# Patient Record
Sex: Male | Born: 1995
Health system: Southern US, Community
[De-identification: ages and names within clinical notes are randomized; demographics above are authoritative.]

## PROBLEM LIST (undated history)

## (undated) DIAGNOSIS — G40909 Epilepsy, unspecified, not intractable, without status epilepticus: Secondary | ICD-10-CM

## (undated) HISTORY — DX: Epilepsy, unspecified, not intractable, without status epilepticus: G40.909

## (undated) HISTORY — PX: CIRCUMCISION: SUR203

---

## 2017-05-23 DIAGNOSIS — G40909 Epilepsy, unspecified, not intractable, without status epilepticus: Secondary | ICD-10-CM

## 2017-05-23 HISTORY — DX: Epilepsy, unspecified, not intractable, without status epilepticus: G40.909

## 2018-01-29 ENCOUNTER — Encounter: Payer: Self-pay | Admitting: *Deleted

## 2018-02-02 ENCOUNTER — Encounter: Payer: Self-pay | Admitting: Diagnostic Neuroimaging

## 2018-02-02 ENCOUNTER — Ambulatory Visit (INDEPENDENT_AMBULATORY_CARE_PROVIDER_SITE_OTHER): Payer: Federal, State, Local not specified - PPO | Admitting: Diagnostic Neuroimaging

## 2018-02-02 VITALS — BP 113/67 | HR 74 | Ht 68.0 in | Wt 147.4 lb

## 2018-02-02 DIAGNOSIS — R569 Unspecified convulsions: Secondary | ICD-10-CM

## 2018-02-02 NOTE — Progress Notes (Signed)
[facility]  PATIENT: Peter Shaw DOB: 11-18-1996  REFERRING CLINICIAN: V Masotti, PA HISTORY FROM: patient  REASON FOR VISIT: new consult     HISTORICAL  CHIEF COMPLAINT:  Chief Complaint  Patient presents with  . Epilepsy    rm 7, New Pt, "seizure on 05/23/17 possibly provoked by work, lack of sleep; no meds x 6 months and no seizure activity"    HISTORY OF PRESENT ILLNESS:   22 year old right-handed male here for evaluation of seizure.  05/23/17 patient was awake at 3:00 in the morning getting ready for a flight, when all of a sudden he collapsed and had loss of consciousness.  No witnessed convulsions.  Patient was evaluated by EMS within 8 minutes.  He was noted to be in a deep sleep, snoring loudly.  Within a few minutes he was able to sit up, confused, but does not remember this.  He finally started to remember events once he was in the emergency room.  Triggering factors could have been sleep deprivation the night before.  No history of alcohol or illicit drugs. Patient has normal birth and development.  No academic problems.  No family history of seizure.  No prior history of seizure or syncope attacks.  In June 2018 had EEG which apparently was suggestive of generalized epilepsy with 2.5-3 Hz generalized spikes during hyperventilation, bifrontally predominant spike and wave in sleep, and a single left frontal spike.  Abnormal discharges noted to be in runs up to 7-10 seconds.  MRI of the brain was unremarkable.  Patient was recommended to start antiseizure medication levetiracetam. However patient was reluctant to start due to side effects.  No further events since that time.  [place]  Now patient living in [place] and attending [employer].   REVIEW OF SYSTEMS: Full 14 system review of systems performed and negative with exception of: only as per HPI.   ALLERGIES: No Known Allergies  HOME  MEDICATIONS: No outpatient medications prior to visit.   No facility-administered medications prior to visit.     PAST MEDICAL HISTORY: Past Medical History:  Diagnosis Date  . Epilepsy (HCC) 05/23/2017    PAST SURGICAL HISTORY: Past Surgical History:  Procedure Laterality Date  . CIRCUMCISION      FAMILY HISTORY: Family History  Problem Relation Age of Onset  . Coronary artery disease Father        triple bypass    SOCIAL HISTORY:  Social History   Socioeconomic History  . Marital status: Single    Spouse name: Not on file  . Number of children: Not on file  . Years of education: Not on file  . Highest education level: Not on file  Social Needs  . Financial resource strain: Not on file  . Food insecurity - worry: Not on file  . Food insecurity - inability: Not on file  . Transportation needs - medical: Not on file  . Transportation needs - non-medical: Not on file  Occupational History    Comment: in college  Tobacco Use  . Smoking status: Never Smoker  . Smokeless tobacco: Never Used  Substance and Sexual Activity  . Alcohol use: No    Frequency: Never  . Drug use: No  . Sexual activity: Not on file  Other Topics Concern  . Not on file  Social History Narrative   Lives with sister   In college [employer]   No caffeine    No children  PHYSICAL EXAM  GENERAL EXAM/CONSTITUTIONAL: Vitals:  Vitals:   02/02/18 0822  BP: 113/67  Pulse: 74  Weight: 147 lb 6.4 oz (66.9 kg)  Height: 5\' 8"  (1.727 m)     Body mass index is 22.41 kg/m.  Visual Acuity Screening   Right eye Left eye Both eyes  Without correction:     With correction: 20/30 20/30   Comments: contacts    Patient is in no distress; well developed, nourished and groomed; neck is supple  CARDIOVASCULAR:  Examination of carotid arteries is normal; no carotid bruits  Regular rate and rhythm, no murmurs  Examination of peripheral vascular system by observation and palpation is  normal  EYES:  Ophthalmoscopic exam of optic discs and posterior segments is normal; no papilledema or hemorrhages  MUSCULOSKELETAL:  Gait, strength, tone, movements noted in Neurologic exam below  NEUROLOGIC: MENTAL STATUS:  No flowsheet data found.  awake, alert, oriented to person, place and time  recent and remote memory intact  normal attention and concentration  language fluent, comprehension intact, naming intact,   fund of knowledge appropriate  CRANIAL NERVE:   2nd - no papilledema on fundoscopic exam  2nd, 3rd, 4th, 6th - pupils equal and reactive to light, visual fields full to confrontation, extraocular muscles intact, no nystagmus  5th - facial sensation symmetric  7th - facial strength symmetric  8th - hearing intact  9th - palate elevates symmetrically, uvula midline  11th - shoulder shrug symmetric  12th - tongue protrusion midline  MOTOR:   normal bulk and tone, full strength in the BUE, BLE  SENSORY:   normal and symmetric to light touch, temperature, vibration  COORDINATION:   finger-nose-finger, fine finger movements normal  REFLEXES:   deep tendon reflexes present and symmetric  GAIT/STATION:   narrow based gait; romberg is negative    DIAGNOSTIC DATA (LABS, IMAGING, TESTING) - I reviewed patient records, labs, notes, testing and imaging myself where available.  No results found for: WBC, HGB, HCT, MCV, PLT No results found for: NA, K, CL, CO2, GLUCOSE, BUN, CREATININE, CALCIUM, PROT, ALBUMIN, AST, ALT, ALKPHOS, BILITOT, GFRNONAA, GFRAA No results found for: CHOL, HDL, LDLCALC, LDLDIRECT, TRIG, CHOLHDL No results found for: ZOXW9U No results found for: VITAMINB12 No results found for: TSH  Outside records reviewed and summarized in HPI.     ASSESSMENT AND PLAN  22 y.o. year old male here with new onset seizure 05/23/17, with abnormal EEG suspicious for primarily generalized epilepsy.   Dx: suspected primary  generalized epilepsy  1. New onset seizure [facility])      PLAN:  - I recommend for patient to start anti-seizure medication (levetiracetam); however patient is reluctant to start due to potential side effects and lack of recurrent seizure in the last 9 months - monitor symptoms  Return in about 6 months (around 08/02/2018).  I reviewed images, labs, notes, records myself. I summarized findings and reviewed with patient, for this high risk condition (new onset seizure) requiring high complexity decision making.    Suanne Marker, MD 02/02/2018, 8:44 AM Certified in Neurology, Neurophysiology and Neuroimaging  [facility] [address], [address] [place], [place] [address] [phone]

## 2018-02-02 NOTE — Patient Instructions (Signed)
-   consider starting anti-seizure medication (levetiracetam)  - Please maintain precautions. Caution with activities where a loss of awareness could harm you or someone else. Caution with swimming alone, tub bathing, hot tubs, driving, operating motorized vehicles (cars, ATVs, motocycles, etc), lawnmowers, power tools or firearms. Caution standing at heights, such as rooftops, ladders or stairs. Avoid hot objects such as stoves, heaters, open fires. Wear a helmet when riding a bicycle, scooter, skateboard, etc. and avoid areas of traffic. Set your water heater to 120 degrees or less.

## 2018-08-11 ENCOUNTER — Ambulatory Visit: Payer: Self-pay | Admitting: Diagnostic Neuroimaging

## 2018-12-02 ENCOUNTER — Encounter: Payer: Self-pay | Admitting: Diagnostic Neuroimaging

## 2018-12-02 ENCOUNTER — Ambulatory Visit: Payer: Federal, State, Local not specified - PPO | Admitting: Diagnostic Neuroimaging

## 2018-12-02 VITALS — BP 128/62 | HR 88 | Ht 68.0 in | Wt 154.2 lb

## 2018-12-02 DIAGNOSIS — R569 Unspecified convulsions: Secondary | ICD-10-CM | POA: Diagnosis not present

## 2018-12-02 DIAGNOSIS — G40309 Generalized idiopathic epilepsy and epileptic syndromes, not intractable, without status epilepticus: Secondary | ICD-10-CM | POA: Diagnosis not present

## 2018-12-02 NOTE — Progress Notes (Signed)
[facility]  PATIENT: Peter Shaw DOB: [birth year]  REFERRING CLINICIAN: V Masotti, PA HISTORY FROM: patient  REASON FOR VISIT: follow up    HISTORICAL  CHIEF COMPLAINT:  Chief Complaint  Patient presents with  . Seizures    rm 6, "never started anti seizure medication; no events, episodes since Feb"  . Follow-up    HISTORY OF PRESENT ILLNESS:   UPDATE (12/02/18, VRP): Since last visit, doing well. No seizures.  Patient asking about his risk for future seizures.  Patient still reluctant to start on antiseizure medication due to concern of potential long-term side effects.  Patient is finishing up his undergraduate degree.  He would like to apply to medical school.  PRIOR HPI (02/02/18): 22 year old right-handed male here for evaluation of seizure.  05/23/17 patient was awake at 3:00 in the morning getting ready for a flight, when all of a sudden he collapsed and had loss of consciousness.  No witnessed convulsions.  Patient was evaluated by EMS within 8 minutes.  He was noted to be in a deep sleep, snoring loudly.  Within a few minutes he was able to sit up, confused, but does not remember this.  He finally started to remember events once he was in the emergency room.  Triggering factors could have been sleep deprivation the night before.  No history of alcohol or illicit drugs. Patient has normal birth and development.  No academic problems.  No family history of seizure.  No prior history of seizure or syncope attacks.  In June 2018 had EEG which apparently was suggestive of generalized epilepsy with 2.5-3 Hz generalized spikes during hyperventilation, bifrontally predominant spike and wave in sleep, and a single left frontal spike.  Abnormal discharges noted to be in runs up to 7-10 seconds.  MRI of the brain was unremarkable.  Patient was recommended to start antiseizure medication levetiracetam. However patient was reluctant to start due to side effects.  No  further events since that time.  [place]  [place] in [place] and attending [employer].     REVIEW OF SYSTEMS: Full 14 system review of systems performed and negative with exception of: only as per HPI.   ALLERGIES: No Known Allergies  HOME MEDICATIONS: No outpatient medications prior to visit.   No facility-administered medications prior to visit.     PAST MEDICAL HISTORY: Past Medical History:  Diagnosis Date  . Epilepsy (HCC) 05/23/2017    PAST SURGICAL HISTORY: Past Surgical History:  Procedure Laterality Date  . CIRCUMCISION      FAMILY HISTORY: Family History  Problem Relation Age of Onset  . Coronary artery disease Father        triple bypass    SOCIAL HISTORY:  Social History   Socioeconomic History  . Marital status: Single    Spouse name: Not on file  . Number of children: Not on file  . Years of education: Not on file  . Highest education level: Not on file  Occupational History    Comment: in college  Social Needs  . Financial resource strain: Not on file  . Food insecurity:    Worry: Not on file    Inability: Not on file  . Transportation needs:    Medical: Not on file    Non-medical: Not on file  Tobacco Use  . Smoking status: Never Smoker  . Smokeless tobacco: Never Used  Substance and Sexual Activity  . Alcohol use: No  Frequency: Never  . Drug use: No  . Sexual activity: Not on file  Lifestyle  . Physical activity:    Days per week: Not on file    Minutes per session: Not on file  . Stress: Not on file  Relationships  . Social connections:    Talks on phone: Not on file    Gets together: Not on file    Attends religious service: Not on file    Active member of club or organization: Not on file    Attends meetings of clubs or organizations: Not on file    Relationship status: Not on file  . Intimate partner violence:    Fear of current or ex partner: Not on  file    Emotionally abused: Not on file    Physically abused: Not on file    Forced sexual activity: Not on file  Other Topics Concern  . Not on file  Social History Narrative   Lives with sister   In college [employer]   No caffeine    No children     PHYSICAL EXAM  GENERAL EXAM/CONSTITUTIONAL: Vitals:  Vitals:   12/02/18 1320  BP: 128/62  Pulse: 88  Weight: 154 lb 3.2 oz (69.9 kg)  Height: 5\' 8"  (1.727 m)   Body mass index is 23.45 kg/m. No exam data present  Patient is in no distress; well developed, nourished and groomed; neck is supple  CARDIOVASCULAR:  Examination of carotid arteries is normal; no carotid bruits  Regular rate and rhythm, no murmurs  Examination of peripheral vascular system by observation and palpation is normal  EYES:  Ophthalmoscopic exam of optic discs and posterior segments is normal; no papilledema or hemorrhages  MUSCULOSKELETAL:  Gait, strength, tone, movements noted in Neurologic exam below  NEUROLOGIC: MENTAL STATUS:  No flowsheet data found.  awake, alert, oriented to person, place and time  recent and remote memory intact  normal attention and concentration  language fluent, comprehension intact, naming intact,   fund of knowledge appropriate  CRANIAL NERVE:   2nd - no papilledema on fundoscopic exam  2nd, 3rd, 4th, 6th - pupils equal and reactive to light, visual fields full to confrontation, extraocular muscles intact, no nystagmus  5th - facial sensation symmetric  7th - facial strength symmetric  8th - hearing intact  9th - palate elevates symmetrically, uvula midline  11th - shoulder shrug symmetric  12th - tongue protrusion midline  MOTOR:   normal bulk and tone, full strength in the BUE, BLE  SENSORY:   normal and symmetric to light touch, temperature, vibration  COORDINATION:   finger-nose-finger, fine finger movements normal  REFLEXES:   deep tendon reflexes present and  symmetric  GAIT/STATION:   narrow based gait    DIAGNOSTIC DATA (LABS, IMAGING, TESTING) - I reviewed patient records, labs, notes, testing and imaging myself where available.  No results found for: WBC, HGB, HCT, MCV, PLT No results found for: NA, K, CL, CO2, GLUCOSE, BUN, CREATININE, CALCIUM, PROT, ALBUMIN, AST, ALT, ALKPHOS, BILITOT, GFRNONAA, GFRAA No results found for: CHOL, HDL, LDLCALC, LDLDIRECT, TRIG, CHOLHDL No results found for: ZOXW9U No results found for: VITAMINB12 No results found for: TSH   Outside records reviewed and summarized in HPI.     ASSESSMENT AND PLAN  22 y.o. year old male here with new onset seizure 05/23/17, with abnormal EEG suspicious for primarily generalized epilepsy.   Dx: suspected primary generalized epilepsy  1. New onset seizure (HCC)  PLAN:   PRIMARY GENERALIZED EPILEPSY - I again recommend for patient to start anti-seizure medication (levetiracetam); risks of breakthrough seizure were reviewed; however patient declines to start due to his concern about potential side effects; I offered second opinion at academic epilepsy clinic but he declined. - monitor symptoms  Return if symptoms worsen or fail to improve, for return to PCP.    Suanne Marker, MD 12/02/2018, 1:25 PM Certified in Neurology, Neurophysiology and Neuroimaging  [facility] [address] [place], [place] [address] [phone]

## 2019-12-02 ENCOUNTER — Other Ambulatory Visit: Payer: Self-pay

## 2019-12-02 ENCOUNTER — Telehealth: Payer: Self-pay | Admitting: Diagnostic Neuroimaging

## 2019-12-02 DIAGNOSIS — R000 Tachycardia, unspecified: Secondary | ICD-10-CM | POA: Diagnosis not present

## 2019-12-02 DIAGNOSIS — R569 Unspecified convulsions: Secondary | ICD-10-CM | POA: Diagnosis not present

## 2019-12-02 DIAGNOSIS — I10 Essential (primary) hypertension: Secondary | ICD-10-CM | POA: Diagnosis not present

## 2019-12-02 NOTE — Telephone Encounter (Signed)
I called pt about wanting to discuss visit from 11/2018. Pt stated he is having night time episodes of some kind of seizure at night  and when he wakes up. Pt stated he is not taking the seizure medication now but has the prescription. Offer first available with [facility] NP for mychart video visit on 12/07/2019 at 0830. I text pt the link for mychart to create account, I advise pt to create account for mychart so he will be able to access the chart. Pt was explain to log onto appt 10 minutes early and click appt for video visit. I advise pt again to create the account before his appt on 12/07/2019. He verbalized understanding.

## 2019-12-02 NOTE — Telephone Encounter (Signed)
Agree with mychart visit. Would recommend to start levetiracetam 500mg  twice a day if he agrees. Also no driving until seizure free x 6 months. -VRP

## 2019-12-02 NOTE — Telephone Encounter (Signed)
Pt is calling in requesting a call back to discuss his last visit and some concerns that he has regarding some courses of action.  CB# [phone]

## 2019-12-02 NOTE — Telephone Encounter (Signed)
Noted TY

## 2019-12-03 DIAGNOSIS — Z03818 Encounter for observation for suspected exposure to other biological agents ruled out: Secondary | ICD-10-CM | POA: Diagnosis not present

## 2019-12-06 NOTE — Patient Instructions (Signed)
According to [place] law, you can not drive unless you are seizure / syncope free for at least 6 months and under physician's care.  Please maintain precautions. Do not participate in activities where a loss of awareness could harm you or someone else. No swimming alone, no tub bathing, no hot tubs, no driving, no operating motorized vehicles (cars, ATVs, motocycles, etc), lawnmowers, power tools or firearms. No standing at heights, such as rooftops, ladders or stairs. Avoid hot objects such as stoves, heaters, open fires. Wear a helmet when riding a bicycle, scooter, skateboard, etc. and avoid areas of traffic. Set your water heater to 120 degrees or less.   Seizure, Adult A seizure is a sudden burst of abnormal electrical activity in the brain. Seizures usually last from 30 seconds to 2 minutes. They can cause many different symptoms. Usually, seizures are not harmful unless they last a long time. What are the causes? Common causes of this condition include:  Fever or infection.  Conditions that affect the brain, such as: ? A brain abnormality that you were born with. ? A brain or head injury. ? Bleeding in the brain. ? A tumor. ? Stroke. ? Brain disorders such as autism or cerebral palsy.  Low blood sugar.  Conditions that are passed from parent to child (are inherited).  Problems with substances, such as: ? Having a reaction to a drug or a medicine. ? Suddenly stopping the use of a substance (withdrawal). In some cases, the cause may not be known. A person who has repeated seizures over time without a clear cause has a condition called epilepsy. What increases the risk? You are more likely to get this condition if you have:  A family history of epilepsy.  Had a seizure in the past.  A brain disorder.  A history of head injury, lack of oxygen at birth, or strokes. What are the signs or symptoms? There are many types of seizures. The symptoms vary depending on the type of  seizure you have. Examples of symptoms during a seizure include:  Shaking (convulsions).  Stiffness in the body.  Passing out (losing consciousness).  Head nodding.  Staring.  Not responding to sound or touch.  Loss of bladder control and bowel control. Some people have symptoms right before and right after a seizure happens. Symptoms before a seizure may include:  Fear.  Worry (anxiety).  Feeling like you may vomit (nauseous).  Feeling like the room is spinning (vertigo).  Feeling like you saw or heard something before (dj vu).  Odd tastes or smells.  Changes in how you see. You may see flashing lights or spots. Symptoms after a seizure happens can include:  Confusion.  Sleepiness.  Headache.  Weakness on one side of the body. How is this treated? Most seizures will stop on their own in under 5 minutes. In these cases, no treatment is needed. Seizures that last longer than 5 minutes will usually need treatment. Treatment can include:  Medicines given through an IV tube.  Avoiding things that are known to cause your seizures. These can include medicines that you take for another condition.  Medicines to treat epilepsy.  Surgery to stop the seizures. This may be needed if medicines do not help. Follow these instructions at home: Medicines  Take over-the-counter and prescription medicines only as told by your doctor.  Do not eat or drink anything that may keep your medicine from working, such as alcohol. Activity  Do not do any activities that would  be dangerous if you had another seizure, like driving or swimming. Wait until your doctor says it is safe for you to do them.  If you live in the [place] ask your local DMV (department of motor vehicles) when you can drive.  Get plenty of rest. Teaching others Teach friends and family what to do when you have a seizure. They should:  Lay you on the ground.  Protect your head and body.  Loosen any tight  clothing around your neck.  Turn you on your side.  Not hold you down.  Not put anything into your mouth.  Know whether or not you need emergency care.  Stay with you until you are better.  General instructions  Contact your doctor each time you have a seizure.  Avoid anything that gives you seizures.  Keep a seizure diary. Write down: ? What you think caused each seizure. ? What you remember about each seizure.  Keep all follow-up visits as told by your doctor. This is important. Contact a doctor if:  You have another seizure.  You have seizures more often.  There is any change in what happens during your seizures.  You keep having seizures with treatment.  You have symptoms of being sick or having an infection. Get help right away if:  You have a seizure that: ? Lasts longer than 5 minutes. ? Is different than seizures you had before. ? Makes it harder to breathe. ? Happens after you hurt your head.  You have any of these symptoms after a seizure: ? Not being able to speak. ? Not being able to use a part of your body. ? Confusion. ? A bad headache.  You have two or more seizures in a row.  You do not wake up right after a seizure.  You get hurt during a seizure. These symptoms may be an emergency. Do not wait to see if the symptoms will go away. Get medical help right away. Call your local emergency services (911 in the [place] Do not drive yourself to the hospital. Summary  Seizures usually last from 30 seconds to 2 minutes. Usually, they are not harmful unless they last a long time.  Do not eat or drink anything that may keep your medicine from working, such as alcohol.  Teach friends and family what to do when you have a seizure.  Contact your doctor each time you have a seizure. This information is not intended to replace advice given to you by your health care provider. Make sure you discuss any questions you have with your health care  provider. Document Released: 05/27/2008 Document Revised: 02/26/2019 Document Reviewed: 02/26/2019 Elsevier Patient Education  [place].

## 2019-12-06 NOTE — Progress Notes (Signed)
PATIENT: Peter Shaw DOB: 06-Dec-1996  REASON FOR VISIT: follow up HISTORY FROM: patient  Virtual Visit via Telephone Note  I connected with Peter Shaw on 12/07/19 at  8:30 AM EST by telephone and verified that I am speaking with the correct person using two identifiers.   I discussed the limitations, risks, security and privacy concerns of performing an evaluation and management service by telephone and the availability of in person appointments. I also discussed with the patient that there may be a patient responsible charge related to this service. The patient expressed understanding and agreed to proceed.   History of Present Illness:  12/07/19 Peter Shaw is a 23 y.o. male here today for follow up for seizures. He reports that recently he has had "events" that are concerning for seizure activity. Three days ago he took a shower at 4:30am. About 5 minutes after getting into bed, his roommate reports that he lost consciousness and extremities became rigid. EMS called. EKG, vitals and CBG were reportedly normal. No obvious triggers that he can identify. No sleep deprivation. No increased stress. He does mention working out and being phsycially tired the day before this event. He does report having two beers the night before last event. He drinks socially but denies excessive alcohol use. He is not taking any antiepileptic medications since last being seen. He is very resistant to taking a medication daily or "for the rest of his life." he states that events are always at night and he does not feel that he is at any risk of harm to others. He lives on campus of [place]. He reports that he rarely has a need to drive as he can walk to everything he needs. He is currently planning to study medicine and wishes to pursue his DO.   History (copied from Dr Richrd Humbles note on 12/02/2018)  UPDATE (12/02/18, VRP): Since last visit, doing well. No seizures.  Patient asking about his risk for future  seizures.  Patient still reluctant to start on antiseizure medication due to concern of potential long-term side effects.  Patient is finishing up his undergraduate degree.  He would like to apply to medical school.  PRIOR HPI (02/02/18): 23 year old right-handed male here for evaluation of seizure. 05/23/17 patient was awake at 3:00 in the morning getting ready for a flight, when all of a sudden he collapsed and had loss of consciousness. No witnessed convulsions. Patient was evaluated by EMS within 8 minutes. He was noted to be in a deep sleep, snoring loudly. Within a few minutes he was able to sit up, confused, but does not remember this. He finally started to remember events once he was in the emergency room. Triggering factors could have been sleep deprivation the night before. No history of alcohol or illicit drugs. Patient has normal birth and development. No academic problems. No family history of seizure. No prior history of seizure or syncope attacks.  In June 2018had EEGwhich apparently was suggestive of generalized epilepsy with 2.5-3 Hz generalized spikes during hyperventilation, bifrontally predominant spike and wave in sleep, and a single left frontal spike. Abnormal discharges noted to be in runs up to 7-10 seconds. MRI of the brain was unremarkable.  Patient was recommended to start antiseizure medication levetiracetam.However patient was reluctant to start due to side effects. No further events since that time.  Patient was previously living in [facility]. Now patient living in [place] and attending [employer].  Observations/Objective:  Generalized: Well developed, in no acute distress  Mentation: Alert oriented to time, place, history taking. Follows all commands speech and language fluent   Assessment and Plan:  23 y.o. year old male  has a past medical history of Epilepsy ([place]) (05/23/2017). here with    ICD-10-CM   1. Nonintractable  generalized idiopathic epilepsy without status epilepticus ([place])  G40.309 Ambulatory referral to Neurology   Mr. Marolyn Hammock has experienced events recently, most consistent with seizure activity.  He reports that these events always occur at night.  He has been unable to identify any specific trigger.  This is the first time he has noted seizure activity since being evaluated by Dr. Leta Baptist in February 2019.  He has been advised by Dr. Leta Baptist and previous neurologist in [place] to start antiepileptic medications.  EEG abnormal in 2018.  He has been very hesitant to do so.  He feels that all events are at night.  He feels that he is in no danger and is not danger to anyone else as he does not drive routinely.  He continues to pursue a degree of medicine and wishes to become a DO.  He is requesting that we refer him to a seizure specialist to consider alternative treatment plan.  He is not interested in taking antiepileptic medications.  I had a lengthy discussion with him regarding the etiology of epilepsy and the unpredictability of this disease.  Fortunately, events thus far have occurred at night with no obvious injuries or disability.  We have discussed side effects and effectiveness of levetiracetam.  He is adamant that he does not wish to take any medications at this time.  I will place a referral to Dr Delice Lesch with [facility] per his request.  He was advised, based on [place] law, cannot drive until seizure-free for 6 months.  Seizure precautions advised.  He should not operate any heavy machinery.  He was advised to seek medical attention immediately for any prolonged seizure activity.  He will follow-up with me in 6 months, sooner if needed.  He verbalizes understanding and agreement with this plan.  Orders Placed This Encounter  Procedures  . Ambulatory referral to Neurology    Referral Priority:   Routine    Referral Type:   Consultation    Referral Reason:   Specialty Services Required     Requested Specialty:   Neurology    Number of Visits Requested:   1    No orders of the defined types were placed in this encounter.    Follow Up Instructions:  I discussed the assessment and treatment plan with the patient. The patient was provided an opportunity to ask questions and all were answered. The patient agreed with the plan and demonstrated an understanding of the instructions.   The patient was advised to call back or seek an in-person evaluation if the symptoms worsen or if the condition fails to improve as anticipated.  I provided 30 minutes of non-face-to-face time during this encounter.  Patient is located at his dorm at [facility].  Provider is in the office.    Debbora Presto, NP

## 2019-12-07 ENCOUNTER — Telehealth (INDEPENDENT_AMBULATORY_CARE_PROVIDER_SITE_OTHER): Payer: Federal, State, Local not specified - PPO | Admitting: Family Medicine

## 2019-12-07 ENCOUNTER — Telehealth: Payer: Self-pay | Admitting: *Deleted

## 2019-12-07 ENCOUNTER — Encounter: Payer: Self-pay | Admitting: Family Medicine

## 2019-12-07 DIAGNOSIS — G40309 Generalized idiopathic epilepsy and epileptic syndromes, not intractable, without status epilepticus: Secondary | ICD-10-CM | POA: Diagnosis not present

## 2019-12-07 NOTE — Telephone Encounter (Signed)
I called pt and he stated that they were basically finished with appt.  He asked about epileptologist referral (would like ofv close to home [facility] if ok).

## 2019-12-08 ENCOUNTER — Encounter: Payer: Self-pay | Admitting: Neurology

## 2019-12-09 NOTE — Progress Notes (Signed)
I reviewed note and agree with plan.   Penni Bombard, MD 03/52/4818, 5:90 PM Certified in Neurology, Neurophysiology and Neuroimaging  [facility] [address], [place], [place] [address] [phone]

## 2019-12-31 ENCOUNTER — Ambulatory Visit: Payer: Federal, State, Local not specified - PPO | Admitting: Neurology

## 2020-01-19 ENCOUNTER — Ambulatory Visit: Payer: Federal, State, Local not specified - PPO | Admitting: Neurology

## 2020-01-26 ENCOUNTER — Other Ambulatory Visit: Payer: Self-pay

## 2020-01-26 ENCOUNTER — Encounter: Payer: Self-pay | Admitting: Neurology

## 2020-01-26 ENCOUNTER — Ambulatory Visit: Payer: Federal, State, Local not specified - PPO | Admitting: Neurology

## 2020-01-26 VITALS — BP 145/72 | HR 60 | Ht 68.0 in | Wt 166.6 lb

## 2020-01-26 DIAGNOSIS — G40309 Generalized idiopathic epilepsy and epileptic syndromes, not intractable, without status epilepticus: Secondary | ICD-10-CM

## 2020-01-26 NOTE — Patient Instructions (Signed)
Great meeting you! Continue to avoid seizure triggers as much as possible. Follow-up as needed. Good luck!  Seizure Precautions: 1. Avoid activities in which a seizure would cause danger to yourself or to others.  Don't operate dangerous machinery, swim alone, or climb in high or dangerous places, such as on ladders, roofs, or girders.  Do not drive unless your doctor says you may.  2. If you have any warning that you may have a seizure, lay down in a safe place where you can't hurt yourself.    3.  No driving for 6 months from last seizure, as per [facility].   Please refer to the following link on the Epilepsy Foundation of America's website for more information: http://www.epilepsyfoundation.org/answerplace/Social/driving/drivingu.cfm   4.  Maintain good sleep hygiene. Avoid alcohol.  5.  Contact your doctor if you have any problems that may be related to the medicine you are taking.  6.  Call 911 and bring the patient back to the ED if:        A.  The seizure lasts longer than 5 minutes.       B.  The patient doesn't awaken shortly after the seizure  C.  The patient has new problems such as difficulty seeing, speaking or moving  D.  The patient was injured during the seizure  E.  The patient has a temperature over 102 F (39C)  F.  The patient vomited and now is having trouble breathing

## 2020-01-26 NOTE — Progress Notes (Signed)
NEUROLOGY CONSULTATION NOTE  Peter Shaw MRN: [id number] DOB: [birth year]  Referring provider: [name], NP Primary care provider: none listed  Reason for consult:  Second opinion regarding seizures   Thank you for your kind referral of Peter Shaw for consultation of the above symptoms. Although his history is well known to you, please allow me to reiterate it for the purpose of our medical record. He is alone in the office today. Records and images were personally reviewed where available.   HISTORY OF PRESENT ILLNESS: This is a pleasant 24 year old right-handed man presenting for second opinion regarding seizures. Records from Dr. Marjory Lies were reviewed. The first seizure occurred at age 89 right after graduation. he was preparing for a trip and woke up early at 4:30am, there were no warning symptoms, he woke up in the hospital with no memory of events. His mother had heard him fall and witnessed a GTC lasting 5 minutes. He was restless when EMS arrived, confused and disoriented, and brought to a hospital in [place]. He had an MRI and EEG. Per Dr. Richrd Humbles note, EEG in June 2018 was apparently suggestive of generalized epilepsy with 2.5-3 Hz generalized spikes during hyperventilation, bifrontally predominant spike and wave in sleep, and a single left frontal spike. Abnormal discharges noted to be in runs up to 7-10 seconds. MRI brain reportedly unremarkable. He was given a prescription for Keppra which he did not take. He traveled to [place] with no issues. The next seizure occurred around 2 years later in December 2020, again around 4am. He recalls falling asleep on the couch, he woke up at 3am, took a shower, then went back to bed at 4am. Around 10 minutes of falling asleep, his roommate heard a loud groan and found him off the bed. There was no report of shaking, he had rolled over, stiff, and was not responding for 5 minutes. No tongue bite or incontinence, he recalls seeing EMS  arrive and felt disoriented. His jaw hurt for a week after, no focal weakness. He reports that both occurred on days of very vigorous activity, the first episode occurred in the setting of sleep deprivation, he reports getting a full 8 hours prior to the second event, but was sleep deprived 2 nights prior. There was no alcohol with the first event, he had 1 beer 2 nights prior to the second one. No illicit substances. He denies any olfactory/gustatory hallucinations, deja vu, rising epigastric sensation, focal numbness/tingling/weakness, myoclonic jerks. He denies any headaches, dizziness, diplopia, dysarthria/dysphagia, neck/back pain, bowel/bladder dysfunction. 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   Epilepsy Risk Factors:  His brother had a GTCs (febrile) at age 75. Otherwise he had a normal birth and early development.  There is no history of febrile convulsions, CNS infections such as meningitis/encephalitis, significant traumatic brain injury, neurosurgical procedures.   PAST MEDICAL HISTORY: Past Medical History:  Diagnosis Date  . Epilepsy ([facility]) 05/23/2017    PAST SURGICAL HISTORY: Past Surgical History:  Procedure Laterality Date  . CIRCUMCISION      MEDICATIONS: No current outpatient medications on file prior to visit.   No current facility-administered medications on file prior to visit.    ALLERGIES: No Known Allergies  FAMILY HISTORY: Family History  Problem Relation Age of Onset  . Coronary artery disease Father        triple bypass    SOCIAL HISTORY: Social History   Socioeconomic History  . Marital status: Single  Spouse name: Not on file  . Number of children: Not on file  . Years of education: Not on file  . Highest education level: Not on file  Occupational History    Comment: in college  Tobacco Use  . Smoking status: Never Smoker  . Smokeless tobacco: Never Used  Substance and Sexual Activity  . Alcohol use: Yes     Comment: wine once a week  . Drug use: No  . Sexual activity: Not on file  Other Topics Concern  . Not on file  Social History Narrative   Lives with sister   In college [employer]   No caffeine    No children   Right handed    Social Determinants of Health   Financial Resource Strain:   . Difficulty of Paying Living Expenses: Not on file  Food Insecurity:   . Worried About Charity fundraiser in the Last Year: Not on file  . Ran Out of Food in the Last Year: Not on file  Transportation Needs:   . Lack of Transportation (Medical): Not on file  . Lack of Transportation (Non-Medical): Not on file  Physical Activity:   . Days of Exercise per Week: Not on file  . Minutes of Exercise per Session: Not on file  Stress:   . Feeling of Stress : Not on file  Social Connections:   . Frequency of Communication with Friends and Family: Not on file  . Frequency of Social Gatherings with Friends and Family: Not on file  . Attends Religious Services: Not on file  . Active Member of Clubs or Organizations: Not on file  . Attends Archivist Meetings: Not on file  . Marital Status: Not on file  Intimate Partner Violence:   . Fear of Current or Ex-Partner: Not on file  . Emotionally Abused: Not on file  . Physically Abused: Not on file  . Sexually Abused: Not on file    REVIEW OF SYSTEMS: Constitutional: No fevers, chills, or sweats, no generalized fatigue, change in appetite Eyes: No visual changes, double vision, eye pain Ear, nose and throat: No hearing loss, ear pain, nasal congestion, sore throat Cardiovascular: No chest pain, palpitations Respiratory:  No shortness of breath at rest or with exertion, wheezes GastrointestinaI: No nausea, vomiting, diarrhea, abdominal pain, fecal incontinence Genitourinary:  No dysuria, urinary retention or frequency Musculoskeletal:  No neck pain, back pain Integumentary: No rash, pruritus, skin lesions Neurological: as above Psychiatric: No  depression, insomnia, anxiety Endocrine: No palpitations, fatigue, diaphoresis, mood swings, change in appetite, change in weight, increased thirst Hematologic/Lymphatic:  No anemia, purpura, petechiae. Allergic/Immunologic: no itchy/runny eyes, nasal congestion, recent allergic reactions, rashes  PHYSICAL EXAM: Vitals:   01/26/20 1304 01/26/20 1306  BP: (!) 160/77 (!) 145/72  Pulse: 60   SpO2: 100%    General: No acute distress Head:  Normocephalic/atraumatic Skin/Extremities: No rash, no edema Neurological Exam: Mental status: alert and oriented to person, place, and time, no dysarthria or aphasia, Fund of knowledge is appropriate.  Recent and remote memory are intact. 3/3 delayed recall.  Attention and concentration are normal.    Able to name objects and repeat phrases. Cranial nerves: CN I: not tested CN II: pupils equal, round and reactive to light, visual fields intact CN III, IV, VI:  full range of motion, no nystagmus, no ptosis CN V: facial sensation intact CN VII: upper and lower face symmetric CN VIII: hearing intact to conversation Bulk & Tone: normal, no fasciculations.  Motor: 5/5 throughout with no pronator drift. Sensation: intact to light touch, cold, pin, vibration and joint position sense.  No extinction to double simultaneous stimulation.  Romberg test negative Deep Tendon Reflexes: +1 on both UE, +2 both LE, no ankle clonus Plantar responses: downgoing bilaterally Cerebellar: no incoordination on finger to nose testing Gait: narrow-based and steady, able to tandem walk adequately. Tremor: none  IMPRESSION: This is a pleasant 24 year old right-handed man with a history of 2 seizures in his lifetime, the first occurred at age 12, then most recently in December 2020. The second episode was a nocturnal seizure, the first episode occurred early in the morning in the setting of sleep deprivation. We discussed EEG findings from 2018 indicating genetic generalized  epilepsy, we discussed the diagnosis, prognosis, treatment, with extensive discussion about recurrence risks in patients with abnormal EEG. We discussed recommendation for starting seizure medication, benefits and risks, particularly risk for SUDEP. We also discussed [place] driving laws indicating no driving until 6 months seizure-free. All his questions were answered, he would like to think about it more but would like to hold off on medication at this time, understanding benefits and risks. We discussed avoidance of seizure triggers, including sleep deprivation and alcohol. He is graduating and may be in a different state in the next few months, he knows to call our office for any questions and will follow-up prn.    Thank you for allowing me to participate in the care of this patient. Please do not hesitate to call for any questions or concerns.   Patrcia Dolly, M.D.  CC: [name], NP

## 2020-08-02 ENCOUNTER — Telehealth: Payer: Self-pay | Admitting: Neurology

## 2020-08-02 NOTE — Telephone Encounter (Signed)
Patient called in wanting to alert Dr. Karel Jarvis of a seizure he recently had and see if she feels he may need to be seen sooner than 04/05/21. He had a seizure on 08/01/20 at 7AM. He said it reminded him of his second seizure he ever had. This seizure involved sleep deprovation, elevation, alcohol, and flashing lights. He had lock jaw for around 4 minutes. After it was over he went to the hospital and they did a CT scan. They did not find anything significant.

## 2020-08-02 NOTE — Telephone Encounter (Signed)
Spoke with pt who reports he had a 4 min seizure on Mon 07/31/20. He had traveled to the [address] with friends to go hiking. He says he was probably a little sleep deprived from travel on Friday, was at a higher altitude (is really wanting an opinion on this as a possible seizure trigger), had drank 2 beers and at the time of the seizure was in the bathroom where the lights were flashing. Was evaluated in the ED and had a normal CT. He is not on any AEDs. Pt understands that he is on the wait list for an appt and has the option of trying to find an earlier appt with a different office, he verbalized understanding.

## 2020-08-02 NOTE — Telephone Encounter (Signed)
Pt is on wait list, he is interested in discussing AEDs.

## 2020-08-02 NOTE — Telephone Encounter (Signed)
We had previously discussed recommendation that he start a seizure medication. Would he like to come in to discuss starting medication? If yes, we'll put him on waitlist. Thanks

## 2020-08-11 ENCOUNTER — Ambulatory Visit: Payer: Federal, State, Local not specified - PPO | Admitting: Neurology

## 2020-08-11 ENCOUNTER — Other Ambulatory Visit: Payer: Self-pay

## 2020-08-11 ENCOUNTER — Encounter: Payer: Self-pay | Admitting: Neurology

## 2020-08-11 VITALS — BP 136/77 | HR 80 | Ht 68.0 in | Wt 168.0 lb

## 2020-08-11 DIAGNOSIS — G40309 Generalized idiopathic epilepsy and epileptic syndromes, not intractable, without status epilepticus: Secondary | ICD-10-CM

## 2020-08-11 NOTE — Patient Instructions (Signed)
1. Schedule 1-hour EEG  2. Recommend looking at the Epilepsy.com website for more information  3. Let me know when you are ready to start medication, options include Keppra (Levetiracetam) and Lamictal (Lamotrigine  4. If you would like a referral to [facility] or another center with a D.O., let me know and I would be happy to send referral for second opinion  5. Follow-up in 6 months, call for any changes   Seizure Precautions: 1. If medication has been prescribed for you to prevent seizures, take it exactly as directed.  Do not stop taking the medicine without talking to your doctor first, even if you have not had a seizure in a long time.   2. Avoid activities in which a seizure would cause danger to yourself or to others.  Don't operate dangerous machinery, swim alone, or climb in high or dangerous places, such as on ladders, roofs, or girders.  Do not drive unless your doctor says you may.  3. If you have any warning that you may have a seizure, lay down in a safe place where you can't hurt yourself.    4.  No driving for 6 months from last seizure, as per [facility].   Please refer to the following link on the Epilepsy Foundation of America's website for more information: http://www.epilepsyfoundation.org/answerplace/Social/driving/drivingu.cfm   5.  Maintain good sleep hygiene. Avoid alcohol.  6.  Contact your doctor if you have any problems that may be related to the medicine you are taking.  7.  Call 911 and bring the patient back to the ED if:        A.  The seizure lasts longer than 5 minutes.       B.  The patient doesn't awaken shortly after the seizure  C.  The patient has new problems such as difficulty seeing, speaking or moving  D.  The patient was injured during the seizure  E.  The patient has a temperature over 102 F (39C)  F.  The patient vomited and now is having trouble breathing

## 2020-08-11 NOTE — Progress Notes (Signed)
NEUROLOGY FOLLOW UP OFFICE NOTE  Peter Shaw [id number] [birth year]  HISTORY OF PRESENT ILLNESS: I had the pleasure of seeing Peter Shaw in follow-up in the neurology clinic on 08/11/2020.  The patient was last seen 6 months ago for primary generalized epilepsy. He is alone in the office today. Since his last visit, he called our office to report a seizure on 07/31/20. He had traveled to the [facility] to go hiking, they went on the hike on Saturday, then early morning Sunday he recalls waking up and brushing his teeth. He recalls the bathroom light was flashing, no warning symptoms, then witnesses saw him go down with rigid shaking for 4 minutes. He states this occurred 35-40 minutes after awakening. There was a little spit and foaming at the mouth, his sister noted a lot of water coming out like he was breathing through his teeth after. He has no recollection of events until he came to on the drive to the hospital, feeling confused. He was brought to a hospital in [place] where head CT was unremarkable, he was given IV fluids and Keppra. He states he only got 4 hours of sleep the night prior and drank 2 beers.    History on Initial Assessment 01/26/2020: This is a pleasant 24 year old right-handed man presenting for second opinion regarding seizures. Records from Dr. Marjory Lies were reviewed. The first seizure occurred at age 57 right after graduation. he was preparing for a trip and woke up early at 4:30am, there were no warning symptoms, he woke up in the hospital with no memory of events. His mother had heard him fall and witnessed a GTC lasting 5 minutes. He was restless when EMS arrived, confused and disoriented, and brought to a hospital in [place]. He had an MRI and EEG. Per Dr. Richrd Humbles note, EEG in June 2018 was apparently suggestive of generalized epilepsy with 2.5-3 Hz generalized spikes during hyperventilation, bifrontally predominant spike and wave in sleep, and a single left frontal  spike. Abnormal discharges noted to be in runs up to 7-10 seconds. MRI brain reportedly unremarkable. He was given a prescription for Keppra which he did not take. He traveled to [place] with no issues. The next seizure occurred around 2 years later in December 2020, again around 4am. He recalls falling asleep on the couch, he woke up at 3am, took a shower, then went back to bed at 4am. Around 10 minutes of falling asleep, his roommate heard a loud groan and found him off the bed. There was no report of shaking, he had rolled over, stiff, and was not responding for 5 minutes. No tongue bite or incontinence, he recalls seeing EMS arrive and felt disoriented. His jaw hurt for a week after, no focal weakness. He reports that both occurred on days of very vigorous activity, the first episode occurred in the setting of sleep deprivation, he reports getting a full 8 hours prior to the second event, but was sleep deprived 2 nights prior. There was no alcohol with the first event, he had 1 beer 2 nights prior to the second one. No illicit substances. He denies any olfactory/gustatory hallucinations, deja vu, rising epigastric sensation, focal numbness/tingling/weakness, myoclonic jerks. He denies any headaches, dizziness, diplopia, dysarthria/dysphagia, neck/back pain, bowel/bladder dysfunction. 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   Epilepsy Risk Factors:  His brother had a GTCs (febrile) at age 32. Otherwise he had a normal birth and early development.  There is no history  of febrile convulsions, CNS infections such as meningitis/encephalitis, significant traumatic brain injury, neurosurgical procedures.   PAST MEDICAL HISTORY: Past Medical History:  Diagnosis Date  . Epilepsy (HCC) 05/23/2017    MEDICATIONS: No current outpatient medications on file prior to visit.   No current facility-administered medications on file prior to visit.    ALLERGIES: No Known Allergies  FAMILY  HISTORY: Family History  Problem Relation Age of Onset  . Coronary artery disease Father        triple bypass    SOCIAL HISTORY: Social History   Socioeconomic History  . Marital status: Single    Spouse name: Not on file  . Number of children: Not on file  . Years of education: Not on file  . Highest education level: Not on file  Occupational History    Comment: in college  Tobacco Use  . Smoking status: Never Smoker  . Smokeless tobacco: Never Used  Substance and Sexual Activity  . Alcohol use: Yes    Comment: wine once a week  . Drug use: No  . Sexual activity: Not on file  Other Topics Concern  . 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 Difficulty of Paying Living Expenses: Not on file  Food Insecurity:   . Worried About Programme researcher, broadcasting/film/video in the Last Year: Not on file  . Ran Out of Food in the Last Year: Not on file  Transportation Needs:   . Lack of Transportation (Medical): Not on file  . Lack of Transportation (Non-Medical): Not on file  Physical Activity:   . Days of Exercise per Week: Not on file  . Minutes of Exercise per Session: Not on file  Stress:   . Feeling of Stress : Not on file  Social Connections:   . Frequency of Communication with Friends and Family: Not on file  . Frequency of Social Gatherings with Friends and Family: Not on file  . Attends Religious Services: Not on file  . Active Member of Clubs or Organizations: Not on file  . Attends Banker Meetings: Not on file  . Marital Status: Not on file  Intimate Partner Violence:   . Fear of Current or Ex-Partner: Not on file  . Emotionally Abused: Not on file  . Physically Abused: Not on file  . Sexually Abused: Not on file    PHYSICAL EXAM: Vitals:   08/11/20 1326  BP: 136/77  Pulse:  80  SpO2: 100%   General: No acute distress Head:  Normocephalic/atraumatic Skin/Extremities: No rash, no edema Neurological Exam: alert and oriented to person, place, and time. No aphasia or dysarthria. Fund of knowledge is appropriate.  Recent and remote memory are intact.  Attention and concentration are normal.  Cranial nerves: Pupils equal, round. Extraocular movements intact. No facial asymmetry. Motor: moves all extremities symmetrically.   IMPRESSION: This is a pleasant 24 yo RH man with a history of 3 seizures in his lifetime, the first occurred at age 73, another in December 2020, and most recently last 07/31/2020. The second seizure was a nocturnal seizure, the other ones occurred early in the morning. All of them have occurred in the setting of sleep deprivation, he was hiking/exerting himself prior to the last  2 ones. We again discussed EEG findings from 2018 indicating a genetic generalized epilepsy. We again discussed recommendation to start anti-epileptic medication, which he is very hesitant to do and to be taking long-term. We had an extensive discussion regarding common seizure triggers, he was advised to avoid seizure triggers, but also risks of recurrent seizures off medication. Options were discussed, including Levetiracetam and Lamotrigine. He may want to obtain a second opinion to help him with decision-making. We discussed repeating 1-hour EEG and he will let us know his decision about medication. He is aware of [place] driving laws to stop driving until 6 months seizure-free. Follow-up in 6 months, he knows to call for any changes.     Thank you for allowing me to participate in his care.  Please do not hesitate to call for any questions or concerns.  The duration of this appointment visit was 33 minutes minutes of face-to-face time with the patient.  Greater than 50% of this time was spent in counseling, explanation of diagnosis, planning of further management, and coordination of  care.   Patrcia Dolly, M.D.

## 2020-08-21 ENCOUNTER — Ambulatory Visit (INDEPENDENT_AMBULATORY_CARE_PROVIDER_SITE_OTHER): Payer: Federal, State, Local not specified - PPO | Admitting: Neurology

## 2020-08-21 ENCOUNTER — Telehealth: Payer: Self-pay | Admitting: Neurology

## 2020-08-21 ENCOUNTER — Other Ambulatory Visit: Payer: Self-pay

## 2020-08-21 DIAGNOSIS — G40309 Generalized idiopathic epilepsy and epileptic syndromes, not intractable, without status epilepticus: Secondary | ICD-10-CM | POA: Diagnosis not present

## 2020-08-21 NOTE — Telephone Encounter (Signed)
Patient would like to try Lamotrigine per Dr Aquino's suggestion. [facility] [address]. Please call

## 2020-08-21 NOTE — Procedures (Signed)
ELECTROENCEPHALOGRAM REPORT  Date of Study: 08/21/2020  Patient's Name: Peter Shaw MRN: [id number] Date of Birth: Nov 15, 1996  Referring Provider: Dr. Patrcia Dolly  Clinical History: This is a 24 year old man with recurrent seizures. EEG for classification.  Medications: none  Technical Summary: A multichannel digital 1-hour EEG recording measured by the international 10-20 system with electrodes applied with paste and impedances below 5000 ohms performed in our laboratory with EKG monitoring in an awake and asleep patient.  Hyperventilation was not performed. Photic stimulation was performed.  The digital EEG was referentially recorded, reformatted, and digitally filtered in a variety of bipolar and referential montages for optimal display.    Description: The patient is awake and asleep during the recording.  During maximal wakefulness, there is a symmetric, medium voltage 9.5-10 Hz posterior dominant rhythm that attenuates with eye opening.  The record is symmetric.  During drowsiness and sleep, there is an increase in theta slowing of the background.  Vertex waves and symmetric sleep spindles were seen.  Photic stimulation did not elicit any abnormalities.  There were occasional generalized high voltage 3-4 Hz spike and polyspike and wave discharges with frontal predominance seen lasting 1-3 seconds. There were no electrographic seizures seen.    EKG lead was unremarkable.  Impression: This 1-hour awake and asleep EEG is abnormal due to occasional generalized high voltage 3-4 Hz spike and polyspike and wave discharges with frontal predominance lasting 1-3 seconds.  Clinical Correlation of the above findings is consistent with a primary generalized epilepsy. Clinical correlation is advised.   Patrcia Dolly, M.D.

## 2020-08-22 MED ORDER — LAMOTRIGINE ER 25 MG PO TB24: ORAL_TABLET | ORAL | 1 refills | Status: DC

## 2020-08-22 NOTE — Telephone Encounter (Signed)
Pls let him know I sent in a prescription for extended-release Lamotrigine to the [facility] on [address]. Instructions are to take 1 tablet daily for 2 weeks, then increase to 2 tablets daily. Please have him call our office after 2 weeks of taking the 2 tabs, if no side effects, we will increase to 100mg  tablet once a day and stay on this dose.   Please let him know that sometimes insurance will not approve the extended-release. If that is the case, we will need to do immediate release, which is taken twice a day instead of once a day. Call if any issues with extended-release when he gets from pharmacy. Thanks

## 2020-08-22 NOTE — Telephone Encounter (Signed)
Patient is calling back to check  The status of this message please call today

## 2020-08-23 NOTE — Telephone Encounter (Signed)
Called patient and informed him of instructions given to him by Dr. Karel Jarvis. Patient verbalized understanding and will call if there are any issues.

## 2020-08-24 ENCOUNTER — Telehealth: Payer: Self-pay

## 2020-08-24 NOTE — Telephone Encounter (Signed)
-----   Message from Karen M Aquino, MD sent at 08/22/2020  1:23 PM EDT ----- Pls let him know that his EEG showed the same changes as his last EEG, indicating a tendency for recurrent seizures if he is not taking medication. Please let us know when he is ready to start medication. Thanks 

## 2020-08-30 NOTE — Telephone Encounter (Signed)
Called patient and informed him of results. Patient stated he has been put on Lamotrigine. Patient verbalized understanding of results.

## 2020-08-30 NOTE — Telephone Encounter (Signed)
-----   Message from Van Clines, MD sent at 08/22/2020  1:23 PM EDT ----- Pls let him know that his EEG showed the same changes as his last EEG, indicating a tendency for recurrent seizures if he is not taking medication. Please let us know when he is ready to start medication. Thanks

## 2020-09-29 ENCOUNTER — Other Ambulatory Visit: Payer: Self-pay

## 2020-09-29 ENCOUNTER — Other Ambulatory Visit: Payer: Self-pay | Admitting: Neurology

## 2020-09-29 ENCOUNTER — Telehealth: Payer: Self-pay

## 2020-09-29 MED ORDER — LAMOTRIGINE ER 25 MG PO TB24: ORAL_TABLET | ORAL | 1 refills | Status: DC

## 2020-09-29 NOTE — Telephone Encounter (Signed)
Patient needs a RX for the lamotrigine 50 mg  [facility] [address]

## 2020-09-29 NOTE — Telephone Encounter (Signed)
Refill called in. 

## 2020-09-29 NOTE — Telephone Encounter (Signed)
Pt wants a call back when we send in the lamictal 50 mg

## 2020-09-29 NOTE — Telephone Encounter (Signed)
Needs new rx sent in for 50mg  for lamictal one daily, insurance will cover 50mg  not 25mg  bid. Thanks Please advise and send in.

## 2020-10-02 ENCOUNTER — Other Ambulatory Visit: Payer: Self-pay

## 2020-10-02 MED ORDER — LAMOTRIGINE ER 50 MG PO TB24: 50.0000 mg | ORAL_TABLET | Freq: Every day | ORAL | 1 refills | Status: DC

## 2020-11-14 DIAGNOSIS — M9901 Segmental and somatic dysfunction of cervical region: Secondary | ICD-10-CM | POA: Diagnosis not present

## 2020-11-14 DIAGNOSIS — M9902 Segmental and somatic dysfunction of thoracic region: Secondary | ICD-10-CM | POA: Diagnosis not present

## 2020-11-14 DIAGNOSIS — M40292 Other kyphosis, cervical region: Secondary | ICD-10-CM | POA: Diagnosis not present

## 2020-11-14 DIAGNOSIS — M62830 Muscle spasm of back: Secondary | ICD-10-CM | POA: Diagnosis not present

## 2020-11-21 ENCOUNTER — Other Ambulatory Visit: Payer: Self-pay | Admitting: Neurology

## 2021-02-16 ENCOUNTER — Other Ambulatory Visit: Payer: Self-pay

## 2021-02-16 ENCOUNTER — Emergency Department (HOSPITAL_COMMUNITY): Payer: Federal, State, Local not specified - PPO

## 2021-02-16 ENCOUNTER — Emergency Department (HOSPITAL_COMMUNITY)
Admission: EM | Admit: 2021-02-16 | Discharge: 2021-02-16 | Disposition: A | Discharge status: Home / Self Care | Payer: Federal, State, Local not specified - PPO | Attending: Emergency Medicine | Admitting: Emergency Medicine

## 2021-02-16 DIAGNOSIS — G40909 Epilepsy, unspecified, not intractable, without status epilepticus: Secondary | ICD-10-CM | POA: Diagnosis not present

## 2021-02-16 DIAGNOSIS — E612 Magnesium deficiency: Secondary | ICD-10-CM | POA: Insufficient documentation

## 2021-02-16 DIAGNOSIS — Z7289 Other problems related to lifestyle: Secondary | ICD-10-CM | POA: Diagnosis not present

## 2021-02-16 DIAGNOSIS — R569 Unspecified convulsions: Secondary | ICD-10-CM | POA: Diagnosis not present

## 2021-02-16 DIAGNOSIS — W01198A Fall on same level from slipping, tripping and stumbling with subsequent striking against other object, initial encounter: Secondary | ICD-10-CM | POA: Insufficient documentation

## 2021-02-16 DIAGNOSIS — R404 Transient alteration of awareness: Secondary | ICD-10-CM | POA: Diagnosis not present

## 2021-02-16 DIAGNOSIS — S0001XA Abrasion of scalp, initial encounter: Secondary | ICD-10-CM | POA: Insufficient documentation

## 2021-02-16 DIAGNOSIS — I471 Supraventricular tachycardia: Secondary | ICD-10-CM | POA: Diagnosis not present

## 2021-02-16 DIAGNOSIS — S0000XA Unspecified superficial injury of scalp, initial encounter: Secondary | ICD-10-CM | POA: Diagnosis not present

## 2021-02-16 DIAGNOSIS — R410 Disorientation, unspecified: Secondary | ICD-10-CM | POA: Diagnosis not present

## 2021-02-16 LAB — CBC WITH DIFFERENTIAL/PLATELET
Abs Immature Granulocytes: 0.03 10*3/uL (ref 0.00–0.07)
Basophils Absolute: 0 10*3/uL (ref 0.0–0.1)
Basophils Relative: 0 %
Eosinophils Absolute: 0 10*3/uL (ref 0.0–0.5)
Eosinophils Relative: 0 %
HCT: 41.6 % (ref 39.0–52.0)
Hemoglobin: 13.9 g/dL (ref 13.0–17.0)
Immature Granulocytes: 1 %
Lymphocytes Relative: 12 %
Lymphs Abs: 0.8 10*3/uL (ref 0.7–4.0)
MCH: 28.2 pg (ref 26.0–34.0)
MCHC: 33.4 g/dL (ref 30.0–36.0)
MCV: 84.4 fL (ref 80.0–100.0)
Monocytes Absolute: 0.5 10*3/uL (ref 0.1–1.0)
Monocytes Relative: 8 %
Neutro Abs: 5 10*3/uL (ref 1.7–7.7)
Neutrophils Relative %: 79 %
Platelets: 248 10*3/uL (ref 150–400)
RBC: 4.93 MIL/uL (ref 4.22–5.81)
RDW: 13.7 % (ref 11.5–15.5)
WBC: 6.3 10*3/uL (ref 4.0–10.5)
nRBC: 0 % (ref 0.0–0.2)

## 2021-02-16 LAB — COMPREHENSIVE METABOLIC PANEL
ALT: 34 U/L (ref 0–44)
AST: 23 U/L (ref 15–41)
Albumin: 4 g/dL (ref 3.5–5.0)
Alkaline Phosphatase: 70 U/L (ref 38–126)
Anion gap: 13 (ref 5–15)
BUN: 11 mg/dL (ref 6–20)
CO2: 22 mmol/L (ref 22–32)
Calcium: 8.6 mg/dL — ABNORMAL LOW (ref 8.9–10.3)
Chloride: 105 mmol/L (ref 98–111)
Creatinine, Ser: 1.06 mg/dL (ref 0.61–1.24)
GFR, Estimated: >60 mL/min (ref >60)
Glucose, Bld: 92 mg/dL (ref 70–99)
Potassium: 3.9 mmol/L (ref 3.5–5.1)
Sodium: 140 mmol/L (ref 135–145)
Total Bilirubin: 0.5 mg/dL (ref 0.3–1.2)
Total Protein: 6.5 g/dL (ref 6.5–8.1)

## 2021-02-16 LAB — MAGNESIUM: Magnesium: 1.6 mg/dL — ABNORMAL LOW (ref 1.7–2.4)

## 2021-02-16 MED ORDER — ACETAMINOPHEN 500 MG PO TABS
1000.0000 mg | ORAL_TABLET | Freq: Once | ORAL | Status: AC
  Administered 2021-02-16: 1000 mg via ORAL
  Filled 2021-02-16: qty 2

## 2021-02-16 MED ORDER — MAGNESIUM SULFATE 2 GM/50ML IV SOLN
2.0000 g | Freq: Once | INTRAVENOUS | Status: AC
  Administered 2021-02-16: 2 g via INTRAVENOUS
  Filled 2021-02-16: qty 50

## 2021-02-16 MED ORDER — SODIUM CHLORIDE 0.9 % IV BOLUS
1000.0000 mL | Freq: Once | INTRAVENOUS | Status: AC
  Administered 2021-02-16: 1000 mL via INTRAVENOUS

## 2021-02-16 NOTE — ED Notes (Signed)
Patient transported to CT 

## 2021-02-16 NOTE — Discharge Instructions (Addendum)
Like we discussed, I would recommend you consider cessation from alcohol in the future. This along with additional stress recently, likely caused your seizure earlier today.  Also, you need to remember to refrain from driving a vehicle until you are free from seizures for a minimum of 6 months.  We also found that your magnesium was a bit low today. I would recommend that you start taking a multivitamin that has magnesium in it.  There is no need to buy an expensive multivitamin.  There are many brands such as [employer], [place], or [place] that are relatively inexpensive.  Please follow-up with your neurologist and schedule an appointment for reevaluation.  You can always return to the emergency department if your symptoms worsen. It was a pleasure to meet you.

## 2021-02-16 NOTE — ED Triage Notes (Addendum)
Pt BIB [facility] for witnessed siezure for approx. 2 min. on [facility] outside of [place].  Pt takes 50mg  Lamotrigine daily.  States he hasnt missed any doses.  Did drink socially last night. Pt has an abrasion to posterior scale.

## 2021-02-16 NOTE — ED Notes (Signed)
PT to CT.

## 2021-02-16 NOTE — ED Notes (Signed)
DC instructions reviewed with pt. PT verbalized understanding. Pt DC °

## 2021-02-16 NOTE — ED Provider Notes (Signed)
[facility] EMERGENCY DEPARTMENT Provider Note   CSN: [id number] Arrival date & time: 02/16/21  1151     History Chief Complaint  Patient presents with  . Seizures    Peter Shaw is a 25 y.o. male.  HPI   Patient is a 25 year old male with a history of epilepsy on 50 mg of Lamictal daily who presents the emergency department due to a witnessed seizure.  This occurred just outside of the hospital.  Patient had a witnessed 2-minute tonic-clonic episode.  Patient fell to the ground and his head brushed across a brick surface resulting in multiple abrasions to the scalp.  No active bleeding at this time.  Mild tenderness as well as a small hematoma noted to the occiput.  No neck pain, back pain, fevers, chills, recent illnesses.  Patient states that he takes his medication daily in the afternoon and never misses a dose.  He is followed by neurology.  He is not a regular drinker but states that he did have about 4 alcoholic drinks last night and was up most of the night, getting very little sleep.  Neurologist: Dr. Patrcia Dolly     Past Medical History:  Diagnosis Date  . Epilepsy (HCC) 05/23/2017    Patient Active Problem List   Diagnosis Date Noted  . New onset seizure (HCC) 12/02/2018  . Nonintractable generalized idiopathic epilepsy without status epilepticus (HCC) 12/02/2018    Past Surgical History:  Procedure Laterality Date  . CIRCUMCISION         Family History  Problem Relation Age of Onset  . Coronary artery disease Father        triple bypass    Social History   Tobacco Use  . Smoking status: Never Smoker  . Smokeless tobacco: Never Used  Substance Use Topics  . Alcohol use: Yes    Comment: wine once a week  . Drug use: No    Home Medications Prior to Admission medications   Medication Sig Start Date End Date Taking? Authorizing Provider  LamoTRIgine (LAMICTAL XR) 25 MG TB24 24 hour tablet Take 1 tablet daily for 2 weeks, then  increase to 2 tablets daily 09/29/20   Van Clines, MD  LamoTRIgine 50 MG TB24 24 hour tablet TAKE 1 TABLET BY MOUTH EVERY DAY 11/21/20   Van Clines, MD    Allergies    Patient has no known allergies.  Review of Systems   Review of Systems  All other systems reviewed and are negative. Ten systems reviewed and are negative for acute change, except as noted in the HPI.    Physical Exam Updated Vital Signs BP 126/72 (BP Location: Right Arm)   Pulse 66   Temp 98.4 F (36.9 C) (Oral)   Resp 16   SpO2 96%   Physical Exam Vitals and nursing note reviewed.  Constitutional:      General: He is not in acute distress.    Appearance: Normal appearance. He is not ill-appearing, toxic-appearing or diaphoretic.  HENT:     Head: Normocephalic.     Comments: Multiple abrasions noted to the posterior scalp with surrounding dried blood.  No significant lacerations noted.    Right Ear: External ear normal.     Left Ear: External ear normal.     Nose: Nose normal.     Mouth/Throat:     Mouth: Mucous membranes are moist.     Pharynx: Oropharynx is clear. No oropharyngeal exudate or posterior oropharyngeal erythema.  Comments: No tongue lacerations noted. Eyes:     General: No scleral icterus.       Right eye: No discharge.        Left eye: No discharge.     Extraocular Movements: Extraocular movements intact.     Conjunctiva/sclera: Conjunctivae normal.     Pupils: Pupils are equal, round, and reactive to light.     Comments: Pupils are equal, round, and reactive to light.  Extraocular movements are intact.  Neck:     Comments: No C-spine pain. Cardiovascular:     Rate and Rhythm: Normal rate and regular rhythm.     Pulses: Normal pulses.     Heart sounds: Normal heart sounds. No murmur heard. No friction rub. No gallop.   Pulmonary:     Effort: Pulmonary effort is normal. No respiratory distress.     Breath sounds: Normal breath sounds. No stridor. No wheezing, rhonchi or  rales.  Abdominal:     General: Abdomen is flat.     Tenderness: There is no abdominal tenderness.  Musculoskeletal:        General: Normal range of motion.     Cervical back: Normal range of motion and neck supple. No tenderness.  Skin:    General: Skin is warm and dry.  Neurological:     General: No focal deficit present.     Mental Status: He is alert and oriented to person, place, and time.     Comments: Patient is oriented to person, place, and time. Patient phonates in clear, complete, and coherent sentences. Negative arm drift. Strength is 5/5 in all four extremities. Distal sensation intact in all four extremities.  Psychiatric:        Mood and Affect: Mood normal.        Behavior: Behavior normal.    ED Results / Procedures / Treatments   Labs (all labs ordered are listed, but only abnormal results are displayed) Labs Reviewed  COMPREHENSIVE METABOLIC PANEL - Abnormal; Notable for the following components:      Result Value   Calcium 8.6 (*)    All other components within normal limits  MAGNESIUM - Abnormal; Notable for the following components:   Magnesium 1.6 (*)    All other components within normal limits  CBC WITH DIFFERENTIAL/PLATELET    EKG None  Radiology CT Head Wo Contrast  Result Date: 02/16/2021 CLINICAL DATA:  Seizure with head trauma. Laceration to occipital region. EXAM: CT HEAD WITHOUT CONTRAST TECHNIQUE: Contiguous axial images were obtained from the base of the skull through the vertex without intravenous contrast. COMPARISON:  None. FINDINGS: Brain: No mass lesion, hemorrhage, hydrocephalus, acute infarct, intra-axial, or extra-axial fluid collection. Vascular: No hyperdense vessel or unexpected calcification. Skull: Minimal left scalp soft tissue swelling on 29/4. No skull fracture. Sinuses/Orbits: Clear paranasal sinuses and mastoid air cells. Other: None IMPRESSION: 1. Left occipital mild scalp soft tissue swelling. 2.  No acute intracranial  abnormality. Electronically Signed   By: Jeronimo Greaves M.D.   On: 02/16/2021 14:36    Procedures Procedures   Medications Ordered in ED Medications  sodium chloride 0.9 % bolus 1,000 mL (0 mLs Intravenous Stopped 02/16/21 1445)  acetaminophen (TYLENOL) tablet 1,000 mg (1,000 mg Oral Given 02/16/21 1312)  magnesium sulfate IVPB 2 g 50 mL (0 g Intravenous Stopped 02/16/21 1602)   ED Course  I have reviewed the triage vital signs and the nursing notes.  Pertinent labs & imaging results that were available during my care of  the patient were reviewed by me and considered in my medical decision making (see chart for details).  Clinical Course as of 02/16/21 1623  Fri Feb 16, 2021  1240 Patient reassessed as well as discussed with my attending physician.  Will obtain basic labs.  We will also check a magnesium level.  Given his significant head trauma will obtain a CT scan of the head.  Liter of normal saline.  Will closely monitor.  Patient also notes that he has his Lamictal with him and typically takes it in the afternoon.  Will let patient take his normal afternoon dose. [LJ]  10 25 yo male on lamictal for seizures presenting to ED with seizure today.  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  Last episode he believes was 6 months ago.  Reports compliance with seizure meds but hasn't taken AM dose today.  He will take it from personal supply in ED.  On exam he has a benign neuro exam, abrasion to back of scalp that is not amenable to staples.  Plan for [facility] given his trauma (reportedly struck head on bricks), check electrolyte levels, if workup unremarkable he can be hydrated and discharged with neurology f/u.  Advised not to drive. [MT]  1400 Mg low, will replete [MT]  1452 CTH negative.  Will replete Mg, anticipate discharge after [MT]    Clinical Course User Index [LJ] Placido Sou,  PA-C [MT] Renaye Rakers Kermit Balo, MD   MDM Rules/Calculators/A&P                          Pt is a 25 y.o. male with a history of epilepsy who presents the emergency department due to a witnessed seizure that occurred just prior to arrival.  Labs: CBC with differential without abnormalities. CMP with a calcium of 8.6.  Otherwise, no abnormalities. Magnesium level 1.6.  Given 1 g of IV magnesium.  Likely low due to his alcohol use last night.  Imaging: CT scan of the head shows left occipital lobe mild scalp soft tissue swelling without acute intracranial abnormalities.  I, Placido Sou, PA-C, personally reviewed and evaluated these images and lab results as part of my medical decision-making.  Physical exam, labs, and imaging are reassuring.  Patient had a mildly decreased magnesium level of 1.6.  Likely secondary to his alcohol use.  Patient given 2 grams of IV magnesium.  Patient monitored in the emergency department for many hours without any repeat seizure-like activity.  Patient took his home dose of Lamictal while in the ED. Patient has a medical background and is aware of his diagnosis as well as the seriousness of it. Recommended outpatient neurology follow-up. Recommended cessation from alcohol use. Discussed cessation from driving until he has 6 months seizure-free. Discussed return precautions. His questions were answered and he was amicable at the time of discharge.  Note: Portions of this report may have been transcribed using voice recognition software. Every effort was made to ensure accuracy; however, inadvertent computerized transcription errors may be present.   Final Clinical Impression(s) / ED Diagnoses Final diagnoses:  Seizure [facility])    Rx / DC Orders ED Discharge Orders    None       Placido Sou, PA-C 02/16/21 1624    Terald Sleeper, MD 02/16/21 [phone]

## 2021-02-16 NOTE — ED Notes (Signed)
Both EDP and PA are aware that pt took 50 mg of Lamictal at this time from home supply.

## 2021-02-18 DIAGNOSIS — Z029 Encounter for administrative examinations, unspecified: Secondary | ICD-10-CM

## 2021-03-01 ENCOUNTER — Other Ambulatory Visit: Payer: Self-pay

## 2021-03-02 ENCOUNTER — Other Ambulatory Visit: Payer: Self-pay

## 2021-03-02 MED ORDER — LAMOTRIGINE ER 50 MG PO TB24: 1.0000 | ORAL_TABLET | Freq: Every day | ORAL | 0 refills | Status: DC

## 2021-03-14 ENCOUNTER — Encounter: Payer: Self-pay | Admitting: Neurology

## 2021-03-14 ENCOUNTER — Ambulatory Visit: Payer: Federal, State, Local not specified - PPO | Admitting: Neurology

## 2021-03-14 DIAGNOSIS — Z029 Encounter for administrative examinations, unspecified: Secondary | ICD-10-CM

## 2021-03-22 ENCOUNTER — Telehealth: Payer: Self-pay | Admitting: Neurology

## 2021-03-22 NOTE — Telephone Encounter (Signed)
Pharmacy has been updated.

## 2021-03-22 NOTE — Telephone Encounter (Signed)
Patient called and said his new pharmacy for future medications is:   [facility] on [address]  Please update patient's chart, per patient.

## 2021-03-23 ENCOUNTER — Other Ambulatory Visit: Payer: Self-pay | Admitting: Neurology

## 2021-04-02 ENCOUNTER — Telehealth: Payer: Self-pay | Admitting: Neurology

## 2021-04-02 NOTE — Telephone Encounter (Signed)
Pt called no answer left a voice mail to call the office  

## 2021-04-02 NOTE — Telephone Encounter (Signed)
Patient has a questions about his lamictal XR 25mg .prescripion.

## 2021-04-03 ENCOUNTER — Other Ambulatory Visit: Payer: Self-pay

## 2021-04-03 MED ORDER — LAMOTRIGINE ER 50 MG PO TB24: 1.0000 | ORAL_TABLET | Freq: Every day | ORAL | 5 refills | Status: DC

## 2021-04-03 NOTE — Telephone Encounter (Signed)
Script called in for pt

## 2021-04-05 ENCOUNTER — Ambulatory Visit: Payer: Federal, State, Local not specified - PPO | Admitting: Neurology

## 2021-07-29 ENCOUNTER — Other Ambulatory Visit: Payer: Self-pay

## 2021-07-29 ENCOUNTER — Emergency Department (HOSPITAL_COMMUNITY): Payer: Federal, State, Local not specified - PPO

## 2021-07-29 ENCOUNTER — Encounter (HOSPITAL_COMMUNITY): Payer: Self-pay

## 2021-07-29 ENCOUNTER — Emergency Department (HOSPITAL_COMMUNITY)
Admission: EM | Admit: 2021-07-29 | Discharge: 2021-07-29 | Disposition: A | Discharge status: Home / Self Care | Payer: Federal, State, Local not specified - PPO | Attending: Emergency Medicine | Admitting: Emergency Medicine

## 2021-07-29 DIAGNOSIS — Z79899 Other long term (current) drug therapy: Secondary | ICD-10-CM | POA: Diagnosis not present

## 2021-07-29 DIAGNOSIS — G40909 Epilepsy, unspecified, not intractable, without status epilepticus: Secondary | ICD-10-CM | POA: Insufficient documentation

## 2021-07-29 DIAGNOSIS — R42 Dizziness and giddiness: Secondary | ICD-10-CM | POA: Insufficient documentation

## 2021-07-29 DIAGNOSIS — Y900 Blood alcohol level of less than 20 mg/100 ml: Secondary | ICD-10-CM | POA: Diagnosis not present

## 2021-07-29 DIAGNOSIS — R569 Unspecified convulsions: Secondary | ICD-10-CM

## 2021-07-29 LAB — BASIC METABOLIC PANEL
Anion gap: 8 (ref 5–15)
BUN: 14 mg/dL (ref 6–20)
CO2: 26 mmol/L (ref 22–32)
Calcium: 9.2 mg/dL (ref 8.9–10.3)
Chloride: 107 mmol/L (ref 98–111)
Creatinine, Ser: 0.66 mg/dL (ref 0.61–1.24)
GFR, Estimated: >60 mL/min (ref >60)
Glucose, Bld: 97 mg/dL (ref 70–99)
Potassium: 4.1 mmol/L (ref 3.5–5.1)
Sodium: 141 mmol/L (ref 135–145)

## 2021-07-29 LAB — CBC WITH DIFFERENTIAL/PLATELET
Abs Immature Granulocytes: 0.02 10*3/uL (ref 0.00–0.07)
Basophils Absolute: 0 10*3/uL (ref 0.0–0.1)
Basophils Relative: 0 %
Eosinophils Absolute: 0 10*3/uL (ref 0.0–0.5)
Eosinophils Relative: 0 %
HCT: 43.5 % (ref 39.0–52.0)
Hemoglobin: 14.3 g/dL (ref 13.0–17.0)
Immature Granulocytes: 0 %
Lymphocytes Relative: 14 %
Lymphs Abs: 0.9 10*3/uL (ref 0.7–4.0)
MCH: 29.1 pg (ref 26.0–34.0)
MCHC: 32.9 g/dL (ref 30.0–36.0)
MCV: 88.4 fL (ref 80.0–100.0)
Monocytes Absolute: 0.5 10*3/uL (ref 0.1–1.0)
Monocytes Relative: 8 %
Neutro Abs: 4.9 10*3/uL (ref 1.7–7.7)
Neutrophils Relative %: 78 %
Platelets: 199 10*3/uL (ref 150–400)
RBC: 4.92 MIL/uL (ref 4.22–5.81)
RDW: 12.1 % (ref 11.5–15.5)
WBC: 6.3 10*3/uL (ref 4.0–10.5)
nRBC: 0 % (ref 0.0–0.2)

## 2021-07-29 LAB — CBG MONITORING, ED: Glucose-Capillary: 84 mg/dL (ref 70–99)

## 2021-07-29 LAB — MAGNESIUM: Magnesium: 2.2 mg/dL (ref 1.7–2.4)

## 2021-07-29 LAB — ETHANOL: Alcohol, Ethyl (B): <10 mg/dL (ref <10)

## 2021-07-29 MED ORDER — SODIUM CHLORIDE 0.9 % IV BOLUS: 500.0000 mL | Freq: Once | INTRAVENOUS | Status: DC

## 2021-07-29 MED ORDER — HYDROMORPHONE HCL 1 MG/ML IJ SOLN
0.5000 mg | Freq: Once | INTRAMUSCULAR | Status: DC
Start: 2021-07-29 — End: 2021-07-29

## 2021-07-29 NOTE — ED Triage Notes (Signed)
Witness Seizure that lasted 4-5 mins. Patient fell from standing position and hit head on ground. Patient states he was working outside when this occurred. Patient A&O x4. Hx of seizures and has taken Lamictal as prescribed with no missed doses.

## 2021-07-29 NOTE — Discharge Instructions (Addendum)
As discussed, your evaluation today has been largely reassuring.  But, it is important that you monitor your condition carefully, and do not hesitate to return to the ED if you develop new, or concerning changes in your condition. ? ?Otherwise, please follow-up with your physician for appropriate ongoing care. ? ?

## 2021-07-29 NOTE — ED Notes (Signed)
D/c paperwork reviewed with pt, including follow up care.  No questions or concerns at time of discharge. Ambulatory to ED exit.

## 2021-07-29 NOTE — ED Notes (Signed)
Pt ambulatory to CT, steady gait.

## 2021-07-29 NOTE — ED Provider Notes (Signed)
[facility]-EMERGENCY DEPT Provider Note   CSN: [id number] Arrival date & time: 07/29/21  0845     History Chief Complaint  Patient presents with   Seizures    Peter Shaw is a 25 y.o. male.  HPI Patient presents after witnessed seizure.  Patient has a seizure disorder, takes Lamictal.  He notes that in spite of taking his medication, seeing his neurologist he has breakthrough seizures once or twice a year.  Today he was in his usual state of health, when he had a brief prodrome, then was witnessed having seizure-like activity.  He notes a typical postictal phase, with lingering lightheadedness, that has resolved.  However, the patient has ongoing pain in the posterior of his head, where he fell and struck it following a seizure.  Patient also has a some jaw tightness, but no tongue swelling, no current weakness in any extremity, no extremity pain. He notes recent benign health, good medication compliance.    Past Medical History:  Diagnosis Date   Epilepsy (HCC) 05/23/2017    Patient Active Problem List   Diagnosis Date Noted   New onset seizure (HCC) 12/02/2018   Nonintractable generalized idiopathic epilepsy without status epilepticus (HCC) 12/02/2018    Past Surgical History:  Procedure Laterality Date   CIRCUMCISION         Family History  Problem Relation Age of Onset   Coronary artery disease Father        triple bypass    Social History   Tobacco Use   Smoking status: Never   Smokeless tobacco: Never  Substance Use Topics   Alcohol use: Yes    Comment: wine once a week   Drug use: No    Home Medications Prior to Admission medications   Medication Sig Start Date End Date Taking? Authorizing Provider  ibuprofen (ADVIL) 200 MG tablet Take 1,000 mg by mouth every 6 (six) hours as needed for mild pain.   Yes [provider]  LamoTRIgine 50 MG TB24 24 hour tablet Take 1 tablet (50 mg total) by mouth daily. 04/03/21  Yes Van Clines, MD    Allergies    Patient has no known allergies.  Review of Systems   Review of Systems  Constitutional:        Per HPI, otherwise negative  HENT:         Per HPI, otherwise negative  Respiratory:         Per HPI, otherwise negative  Cardiovascular:        Per HPI, otherwise negative  Gastrointestinal:  Negative for vomiting.  Endocrine:       Negative aside from HPI  Genitourinary:        Neg aside from HPI   Musculoskeletal:        Per HPI, otherwise negative  Skin: Negative.   Neurological:  Positive for seizures. Negative for syncope.   Physical Exam Updated Vital Signs BP 122/84 (BP Location: Left Arm)   Pulse 84   Temp 98.2 F (36.8 C) (Oral)   Resp 16   Ht 5\' 8"  (1.727 m)   Wt 76 kg   SpO2 100%   BMI 25.48 kg/m   Physical Exam Vitals and nursing note reviewed.  Constitutional:      General: He is not in acute distress.    Appearance: He is well-developed.  HENT:     Head: Normocephalic.      Comments: No malocclusion, no broken teeth Eyes:  Conjunctiva/sclera: Conjunctivae normal.  Cardiovascular:     Rate and Rhythm: Normal rate and regular rhythm.  Pulmonary:     Effort: Pulmonary effort is normal. No respiratory distress.     Breath sounds: No stridor.  Abdominal:     General: There is no distension.  Musculoskeletal:     Cervical back: Full passive range of motion without pain and neck supple. No spinous process tenderness or muscular tenderness.  Skin:    General: Skin is warm and dry.  Neurological:     General: No focal deficit present.     Mental Status: He is alert and oriented to person, place, and time.    ED Results / Procedures / Treatments   Labs (all labs ordered are listed, but only abnormal results are displayed) Labs Reviewed  BASIC METABOLIC PANEL  CBC WITH DIFFERENTIAL/PLATELET  MAGNESIUM  ETHANOL  CBG MONITORING, ED    EKG None  Radiology CT HEAD WO CONTRAST ( )  Result Date:  07/29/2021 CLINICAL DATA:  25 year old male with history of head trauma from a fall during a seizure. EXAM: CT HEAD WITHOUT CONTRAST TECHNIQUE: Contiguous axial images were obtained from the base of the skull through the vertex without intravenous contrast. COMPARISON:  Head CT 02/16/2021. FINDINGS: Brain: No evidence of acute infarction, hemorrhage, hydrocephalus, extra-axial collection or mass lesion/mass effect. Vascular: No hyperdense vessel or unexpected calcification. Skull: Normal. Negative for fracture or focal lesion. Sinuses/Orbits: No acute finding. Other: None. IMPRESSION: 1. No acute displaced skull fractures or acute intracranial abnormalities. The appearance of the brain is normal. Electronically Signed   By: Trudie Reed M.D.   On: 07/29/2021 09:49    Procedures Procedures   Medications Ordered in ED Medications  sodium chloride 0.9 % bolus 500 mL (500 mLs Intravenous Not Given 07/29/21 1023)    ED Course  I have reviewed the triage vital signs and the nursing notes.  Pertinent labs & imaging results that were available during my care of the patient were reviewed by me and considered in my medical decision making (see chart for details).  Old male, seizure disorder, compliant with meds presents after witnessed seizure with head trauma.  Patient is awake, alert, in no distress but at time of my evaluation, has no focal neuro complaints, no evidence for status.  Patient's head CT reviewed, discussed, no evidence for intracranial injury, fracture. Labs reviewed, no substantial electrolyte abnormalities.  Patient comfortable with, appropriate for discharge with neuro follow-up as an outpatient. Final Clinical Impression(s) / ED Diagnoses Final diagnoses:  Seizure [facility])    Rx / DC Orders ED Discharge Orders     None        Gerhard Munch, MD 07/29/21 1040

## 2021-07-29 NOTE — ED Provider Notes (Signed)
Emergency Medicine Provider Triage Evaluation Note  Peter Shaw , a 25 y.o. male  was evaluated in triage.  Pt complains of seizure.  Patient has history of seizures, takes Lamictal, no missed doses.  Patient arrives with his friend today who witnessed the seizure that occurred around 7:30 AM.  Patient began having a seizure from a standing position fell to the ground.  Seizure lasted approximately 4-5 minutes.  Patient denies any incontinence or intraoral injury.  Patient reports some soreness of the right occipital scalp where he has a small laceration.  No other injuries or concerns.  Review of Systems  Positive: Head laceration, seizure Negative: Recent illness, fever/chills, neck pain, back pain, chest pain, abdominal pain, nausea/vomiting, diarrhea or any additional injuries or concerns.  Physical Exam  BP 122/84 (BP Location: Left Arm)   Pulse 84   Temp 98.2 F (36.8 C) (Oral)   Resp 16   Ht 5\' 8"  (1.727 m)   Wt 76 kg   SpO2 100%   BMI 25.48 kg/m  Gen:   Awake, no distress alert and oriented x4 Resp:  Normal effort  MSK:   Moves extremities without difficulty  Other:  Small laceration right occipital scalp without active bleeding.  No raccoon eyes.  No battle signs.  No hemotympanum.  No epistaxis.  No intraoral injury.  Medical Decision Making  Medically screening exam initiated at 9:16 AM.  Appropriate orders placed.  Peter Shaw was informed that the remainder of the evaluation will be completed by another provider, this initial triage assessment does not replace that evaluation, and the importance of remaining in the ED until their evaluation is complete.  Note: Portions of this report may have been transcribed using voice recognition software. Every effort was made to ensure accuracy; however, inadvertent computerized transcription errors may still be present.    Jaclyn Prime 07/29/21 09/28/21    [id number], MD 07/29/21 1006

## 2021-07-30 ENCOUNTER — Telehealth: Payer: Self-pay

## 2021-07-30 ENCOUNTER — Other Ambulatory Visit: Payer: Self-pay

## 2021-07-30 MED ORDER — LAMOTRIGINE ER 50 MG PO TB24: ORAL_TABLET | ORAL | 1 refills | Status: DC

## 2021-07-30 NOTE — Telephone Encounter (Signed)
Patient advised of increase dose change of lamotrigine ER 100mg  pt voiced understanding, sent Rx to pharmacy.

## 2021-08-23 ENCOUNTER — Telehealth: Payer: Self-pay | Admitting: Neurology

## 2021-08-23 NOTE — Telephone Encounter (Signed)
Pt called in about his last RX that was sent in. He stated it was supposed to be upped to 100mg  1xday, but the prescription that was sent in was 50mg  1xday. So he has been doubling his 50's and wants to know does he just use the refill or are you going to send in another RX for the correct dosage

## 2021-08-24 ENCOUNTER — Other Ambulatory Visit: Payer: Self-pay

## 2021-08-24 MED ORDER — LAMOTRIGINE 100 MG PO TABS: 100.0000 mg | ORAL_TABLET | Freq: Every day | ORAL | 1 refills | Status: DC

## 2021-08-24 MED ORDER — LAMOTRIGINE ER 100 MG PO TB24: ORAL_TABLET | ORAL | 3 refills | Status: DC

## 2021-08-24 NOTE — Addendum Note (Signed)
Addended by: Van Clines on: 08/24/2021 08:25 AM   Modules accepted: Orders

## 2021-08-24 NOTE — Telephone Encounter (Signed)
New script for 100mg  Lamictal sent in to the pharmacy for pt. Pt called and informed that new script was sent for pt .

## 2021-08-28 ENCOUNTER — Telehealth: Payer: Self-pay | Admitting: Neurology

## 2021-08-28 MED ORDER — LAMOTRIGINE ER 100 MG PO TB24: ORAL_TABLET | ORAL | 3 refills | Status: DC

## 2021-08-28 NOTE — Addendum Note (Signed)
Addended by: Dimas Chyle on: 08/28/2021 02:33 PM   Modules accepted: Orders

## 2021-08-28 NOTE — Telephone Encounter (Signed)
Pt called in stating he had another seizure on Saturday. He said the last time he was told to double the dosage of his Lamotrigine. He was not sure if he now needs to take 200 mg?

## 2021-08-28 NOTE — Telephone Encounter (Signed)
Can you pls check if any triggers? If none, would increase Lamotrigine ER 100mg : Take 2 tabs daily. Pls send in Rx and have him put on waitlist. No driving until 6 months seizure-free. Thanks

## 2021-08-28 NOTE — Telephone Encounter (Signed)
Pt c/o: seizure Missed medications?  No. Sleep deprived?  Yes.   Alcohol intake?  No. Back to their usual baseline self?  Yes.  . If no, advise go to ER Current medications prescribed by Dr. Karel Jarvis: Lamotrigine    increase Lamotrigine ER 100mg : Take 2 tabs daily. Pt put on waitlist. No driving until 6 months seizure-free, avoid sleep deprivation

## 2021-08-28 NOTE — Addendum Note (Signed)
Addended by: Van Clines on: 08/28/2021 03:07 PM   Modules accepted: Orders

## 2021-09-10 ENCOUNTER — Encounter: Payer: Self-pay | Admitting: Neurology

## 2021-09-10 ENCOUNTER — Other Ambulatory Visit: Payer: Self-pay

## 2021-09-10 ENCOUNTER — Ambulatory Visit: Payer: Federal, State, Local not specified - PPO | Admitting: Neurology

## 2021-09-10 VITALS — BP 144/85 | HR 81 | Ht 68.0 in | Wt 155.2 lb

## 2021-09-10 DIAGNOSIS — G40309 Generalized idiopathic epilepsy and epileptic syndromes, not intractable, without status epilepticus: Secondary | ICD-10-CM | POA: Diagnosis not present

## 2021-09-10 MED ORDER — LAMOTRIGINE ER 100 MG PO TB24: ORAL_TABLET | ORAL | 11 refills | Status: DC

## 2021-09-10 NOTE — Patient Instructions (Signed)
Take the Lamictal extended-release 100mg : 1 tablet in AM, 1 tablet in PM  2. Schedule MRI brain with and without contrast  3. Follow-up in 3-4 months, call for any changes   Seizure Precautions: 1. If medication has been prescribed for you to prevent seizures, take it exactly as directed.  Do not stop taking the medicine without talking to your doctor first, even if you have not had a seizure in a long time.   2. Avoid activities in which a seizure would cause danger to yourself or to others.  Don't operate dangerous machinery, swim alone, or climb in high or dangerous places, such as on ladders, roofs, or girders.  Do not drive unless your doctor says you may.  3. If you have any warning that you may have a seizure, lay down in a safe place where you can't hurt yourself.    4.  No driving for 6 months from last seizure, as per [facility].   Please refer to the following link on the Epilepsy Foundation of America's website for more information: http://www.epilepsyfoundation.org/answerplace/Social/driving/drivingu.cfm   5.  Maintain good sleep hygiene. Avoid alcohol.  6.  Contact your doctor if you have any problems that may be related to the medicine you are taking.  7.  Call 911 and bring the patient back to the ED if:        A.  The seizure lasts longer than 5 minutes.       B.  The patient doesn't awaken shortly after the seizure  C.  The patient has new problems such as difficulty seeing, speaking or moving  D.  The patient was injured during the seizure  E.  The patient has a temperature over 102 F (39C)  F.  The patient vomited and now is having trouble breathing

## 2021-09-10 NOTE — Progress Notes (Signed)
NEUROLOGY FOLLOW UP OFFICE NOTE  Peter Shaw [id number] 01/22/1996  HISTORY OF PRESENT ILLNESS: I had the pleasure of seeing Peter Shaw in follow-up in the neurology clinic on 09/10/2021.  The patient was last seen a year ago for primary generalized epilepsy. His EEG in 07/2020 again showed occasional generalized high voltage 3-4 Hz spike and polyspike and wave discharges with frontal predominance lasting 1-3 seconds. He started low dose Lamictal, then had another seizure on 02/16/21 in the setting of stress, alcohol, and sleep deprivation. He had another GTC on 07/29/21, there was no prior warning, he was outside and his friend said he looked up to the sky then fell back. At that time, he was getting over COVID infection. Lamictal XR increased to 100mg  daily. He then had another seizure on 08/26/21 in the setting of sleep deprivation. He works night shift, after work her got 2 hours of sleep then went to the gym. With this seizure, he was reaching up with one hand then fell down with body movements ("not shaking"). He bit his tongue. He  Lamictal XR increased to 200mg  every morning. He denies any staring/unresponsive episodes, olfactory/gustatory hallucinations, focal numbness/tingling/weakness, myoclonic jerks. No headaches, dizziness, diplopia. No side effects on Lamictal XR. He works night shift 3 days a week (7pm-7am). He usually gets 7 hours of sleep.    History on Initial Assessment 01/26/2020: This is a pleasant 25 year old right-handed man presenting for second opinion regarding seizures. Records from Dr. 03/25/2020 were reviewed. The first seizure occurred at age 63 right after graduation. he was preparing for a trip and woke up early at 4:30am, there were no warning symptoms, he woke up in the hospital with no memory of events. His mother had heard him fall and witnessed a GTC lasting 5 minutes. He was restless when EMS arrived, confused and disoriented, and brought to a hospital in [place]. He  had an MRI and EEG. Per Dr. 15 note, EEG in June 2018 was apparently suggestive of generalized epilepsy with 2.5-3 Hz generalized spikes during hyperventilation, bifrontally predominant spike and wave in sleep, and a single left frontal spike. Abnormal discharges noted to be in runs up to 7-10 seconds. MRI brain reportedly unremarkable. He was given a prescription for Keppra which he did not take. He traveled to [place] with no issues. The next seizure occurred around 2 years later in December 2020, again around 4am. He recalls falling asleep on the couch, he woke up at 3am, took a shower, then went back to bed at 4am. Around 10 minutes of falling asleep, his roommate heard a loud groan and found him off the bed. There was no report of shaking, he had rolled over, stiff, and was not responding for 5 minutes. No tongue bite or incontinence, he recalls seeing EMS arrive and felt disoriented. His jaw hurt for a week after, no focal weakness. He reports that both occurred on days of very vigorous activity, the first episode occurred in the setting of sleep deprivation, he reports getting a full 8 hours prior to the second event, but was sleep deprived 2 nights prior. There was no alcohol with the first event, he had 1 beer 2 nights prior to the second one. No illicit substances. He denies any olfactory/gustatory hallucinations, deja vu, rising epigastric sensation, focal numbness/tingling/weakness, myoclonic jerks. He denies any headaches, dizziness, diplopia, dysarthria/dysphagia, neck/back pain, bowel/bladder dysfunction. He is a [place], memory is good.   Epilepsy Risk Factors:  His brother had a GTCs (febrile) at age 47. Otherwise he had a normal birth and early development.  There is no history of febrile convulsions, CNS infections such as meningitis/encephalitis, significant traumatic brain injury, neurosurgical procedures.  Diagnostic Data: MRI brain without contrast  2018: no acute changes, small benign pars intermedia cyst of the pituitary gland EEG in June 2018 was apparently suggestive of generalized epilepsy with 2.5-3 Hz generalized spikes during hyperventilation, bifrontally predominant spike and wave in sleep, and a single left frontal spike. Abnormal discharges noted to be in runs up to 7-10 seconds.  EEG in August 2021 abnormal due to occasional generalized high voltage 3-4 Hz spike and polyspike and wave discharges with frontal predominance lasting 1-3 seconds   PAST MEDICAL HISTORY: Past Medical History:  Diagnosis Date   Epilepsy (HCC) 05/23/2017    MEDICATIONS: Current Outpatient Medications on File Prior to Visit  Medication Sig Dispense Refill   ibuprofen (ADVIL) 200 MG tablet Take 1,000 mg by mouth every 6 (six) hours as needed for mild pain.     LamoTRIgine 100 MG TB24 24 hour tablet Take 2 tablet daily 60 tablet 3   No current facility-administered medications on file prior to visit.    ALLERGIES: No Known Allergies  FAMILY HISTORY: Family History  Problem Relation Age of Onset   Coronary artery disease Father        triple bypass    SOCIAL HISTORY: Social History   Socioeconomic History   Marital status: Single    Spouse name: Not on file   Number of children: Not on file   Years of education: Not on file   Highest education level: Not on file  Occupational History    Comment: in college  Tobacco Use   Smoking status: Never   Smokeless tobacco: Never  Vaping Use   Vaping Use: Never used  Substance and Sexual Activity   Alcohol use: Not Currently    Comment: wine once a week   Drug use: No   Sexual activity: Not on file  Other Topics Concern   Not on file  Social History Narrative   Lives with sister   In college [employer]   No caffeine    No children   Right handed    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 No aphasia or dysarthria. Fund of knowledge is appropriate.  Recent and remote memory are intact.  Attention and concentration are normal.   Cranial nerves: Pupils equal, round. Extraocular movements intact with no nystagmus. Visual fields full.  No facial asymmetry.  Motor: Bulk and tone normal, muscle strength 5/5 throughout with no pronator drift.   Finger to nose testing intact.  Gait narrow-based and steady, able to tandem walk adequately.  Romberg negative.   IMPRESSION: This is a pleasant 25 yo RH man with primary generalized epilepsy. He has had 3 convulsions this year, most recently last 08/26/21. Most have occurred in the setting of sleep deprivation. He works  third shift 3 days a week and has a different sleep schedule. We discussed taking the Lamictal XR 100mg  twice a day to smoothen out levels. With last seizure, he reports pointing his hand to the sky, which is a change from his prior seizures. MRI brain with and without contrast will be ordered to assess for underlying structural abnormality. He is aware of [place] driving laws to stop driving after a seizure until 6 months seizure-free. We again discussed avoidance of seizure triggers, including missing medication, alcohol, and sleep deprivation. Follow-up in 3-4 months, call for any changes.   Thank you for allowing me to participate in his care.  Please do not hesitate to call for any questions or concerns.   , M.D.

## 2021-10-09 ENCOUNTER — Telehealth: Payer: Self-pay | Admitting: Neurology

## 2021-10-09 NOTE — Telephone Encounter (Signed)
Caller states he has a question about his Lamictal XR 200 mg daily that was recently prescribed to him, states he takes 2 a day (currently 100 mg in the AM and 100 mg in the evening). States they only release 30 tabs each prescription and the rx is only lasting him 15 days instead of 30 days. Patient would like to see if there is any way that the rx can be adjusted so his medication can last him 30 days.

## 2021-10-09 NOTE — Telephone Encounter (Signed)
I can try sending a 90-day prescription and see if this helps. Not sure why this is the case, is it a supply issue with pharmacy? Can you pls check with pharmacy what is issue, thanks!

## 2021-10-09 NOTE — Telephone Encounter (Signed)
Called [facility] pharmacy and was informed that patient picked up Lamictal(100 mg) 60 tablets on 09/04/2021 and 90 tablets on 10/01/21. Pharmacist stated that what happen was an old Rx was on auto fill and it was filled for patient. She has inactivated that old Rx so it does not auto fill for patient. Correct prescription is on file for patient. Patient should have enough Lamictal to last until 11/15/21.

## 2021-10-10 NOTE — Telephone Encounter (Signed)
Pt called in returning Mahina's call 

## 2021-10-10 NOTE — Telephone Encounter (Signed)
Called patient and left a message for a call back.  

## 2021-10-10 NOTE — Telephone Encounter (Signed)
Pls let patient know this is what pharmacy said, not sure why he only has 30 tablets if they say he got 60 and 90 tablets.

## 2021-10-11 DIAGNOSIS — Z0000 Encounter for general adult medical examination without abnormal findings: Secondary | ICD-10-CM | POA: Diagnosis not present

## 2021-10-11 NOTE — Telephone Encounter (Signed)
Pt called and informed that issue with the pharmacy has been fixed

## 2021-10-19 DIAGNOSIS — Z23 Encounter for immunization: Secondary | ICD-10-CM | POA: Diagnosis not present

## 2021-12-03 DIAGNOSIS — D2362 Other benign neoplasm of skin of left upper limb, including shoulder: Secondary | ICD-10-CM | POA: Diagnosis not present

## 2021-12-09 ENCOUNTER — Other Ambulatory Visit: Payer: Self-pay | Admitting: Neurology

## 2021-12-18 ENCOUNTER — Ambulatory Visit: Payer: Federal, State, Local not specified - PPO | Admitting: Neurology

## 2021-12-18 ENCOUNTER — Encounter: Payer: Self-pay | Admitting: Neurology

## 2022-01-13 ENCOUNTER — Other Ambulatory Visit: Payer: Self-pay | Admitting: Neurology

## 2022-02-11 ENCOUNTER — Other Ambulatory Visit: Payer: Self-pay

## 2022-02-11 ENCOUNTER — Telehealth: Payer: Self-pay | Admitting: Neurology

## 2022-02-11 DIAGNOSIS — G40309 Generalized idiopathic epilepsy and epileptic syndromes, not intractable, without status epilepticus: Secondary | ICD-10-CM

## 2022-02-11 MED ORDER — LAMOTRIGINE ER 100 MG PO TB24: ORAL_TABLET | ORAL | 1 refills | Status: DC

## 2022-02-11 NOTE — Telephone Encounter (Signed)
Patient has a question about his Lamictal dosage.  [facility]

## 2022-02-11 NOTE — Telephone Encounter (Signed)
Spoke to patient about reducing his triggers he said sometimes he does not get enough sleep. Patient is going to check in with the office in 3 weeks to let us know how he is doing and he currently has medication but I will change on medication list and told him to let us know when he will need a refill

## 2022-02-11 NOTE — Telephone Encounter (Signed)
Pls confirm with him that there are no other triggers, such as missing medication, poor sleep, alcohol, because even if we increase medication if these are present, would not help as much. Ok to increase Lamotrigine ER 100mg : Take 1 tablet in AM, 2 tablets in PM. Pls send in updated Rx, thanks

## 2022-02-11 NOTE — Telephone Encounter (Signed)
Patient has noticed over the last month that he is waking up and feeling very stiff , he bite his tongue , and he is having pain in his jaw. He feels has been having seizures at night. Patient wanting to know if his Lamictal can be adjusted. Patient missed his last appointment but is rescheduled for 7-23

## 2022-03-06 ENCOUNTER — Telehealth: Payer: Self-pay | Admitting: Neurology

## 2022-03-06 NOTE — Telephone Encounter (Signed)
Patient called and stated he wanted to give an update on his medication.  He also wants to go over some of his prescriptions. ?

## 2022-03-07 NOTE — Telephone Encounter (Signed)
Good to hear, pls order Lamictal level with instructions to have it done first thing in the morning before he takes his morning dose, then take medication after blood draw. Thanks ?

## 2022-03-07 NOTE — Telephone Encounter (Signed)
Pt stated that he is doing good on his medication asking about having lab work done to have his  lamotrigine level checked? Stated that he just wanted to make sure it was in range with the increase in dose, ?

## 2022-03-07 NOTE — Telephone Encounter (Signed)
Pt called informed Dr Karel Jarvis stated Good to hear, pls order Lamictal level with instructions to have it done first thing in the morning before he takes his morning dose, then take medication after blood draw ?

## 2022-03-13 ENCOUNTER — Other Ambulatory Visit (INDEPENDENT_AMBULATORY_CARE_PROVIDER_SITE_OTHER): Payer: Federal, State, Local not specified - PPO

## 2022-03-13 ENCOUNTER — Other Ambulatory Visit: Payer: Self-pay

## 2022-03-13 DIAGNOSIS — G40309 Generalized idiopathic epilepsy and epileptic syndromes, not intractable, without status epilepticus: Secondary | ICD-10-CM | POA: Diagnosis not present

## 2022-03-14 LAB — LAMOTRIGINE LEVEL: Lamotrigine Lvl: 5.3 ug/mL (ref 2.0–20.0)

## 2022-03-15 ENCOUNTER — Other Ambulatory Visit: Payer: Self-pay | Admitting: Neurology

## 2022-03-15 ENCOUNTER — Telehealth: Payer: Self-pay | Admitting: Neurology

## 2022-03-15 MED ORDER — LAMOTRIGINE ER 200 MG PO TB24
ORAL_TABLET | ORAL | 3 refills | Status: DC
Start: 2022-03-15 — End: 2022-03-18

## 2022-03-15 NOTE — Telephone Encounter (Signed)
Since he is still having symptoms, agree with increase in dose, pls let him know a new Rx has been sent in for Lamotrigine ER 200mg  tablets: take 1 tablet twice a day. Thanks ?

## 2022-03-15 NOTE — Telephone Encounter (Signed)
Pt called with lab results he is asking if we can increase his Lamictal some because he is still waking up with his jaw stiff and bite marks on his tongue,  ?

## 2022-03-15 NOTE — Telephone Encounter (Signed)
Pt called an informed Since he is still having symptoms, agree with increase in dose, pls let him know a new Rx has been sent in for Lamotrigine ER 200mg  tablets: take 1 tablet twice a day pt verbalized understanding  ?

## 2022-03-15 NOTE — Telephone Encounter (Signed)
Patient is calling about his results. Stated he sees his results, and has some questions ?

## 2022-06-24 ENCOUNTER — Encounter: Payer: Self-pay | Admitting: Neurology

## 2022-06-24 ENCOUNTER — Ambulatory Visit (INDEPENDENT_AMBULATORY_CARE_PROVIDER_SITE_OTHER): Payer: Federal, State, Local not specified - PPO | Admitting: Neurology

## 2022-06-24 VITALS — BP 133/86 | HR 116 | Resp 18 | Ht 68.0 in | Wt 159.0 lb

## 2022-06-24 DIAGNOSIS — G40309 Generalized idiopathic epilepsy and epileptic syndromes, not intractable, without status epilepticus: Secondary | ICD-10-CM

## 2022-06-24 MED ORDER — LAMOTRIGINE ER 200 MG PO TB24: ORAL_TABLET | ORAL | 3 refills | Status: DC

## 2022-06-24 NOTE — Progress Notes (Unsigned)
NEUROLOGY FOLLOW UP OFFICE NOTE  Peter Shaw [id number] 07-06-1996  HISTORY OF PRESENT ILLNESS: I had the pleasure of seeing Peter Shaw in follow-up in the neurology clinic on 06/24/2022. He is alone in the office today. The patient was last seen 10 months ago for primary generalized epilepsy. EEG in 07/2020 showed occasional generalized high voltage 3-4 Hz spike and polyspike and wave discharges with frontal predominance lasting 1-3 seconds. He contacted our office in 01/2022 about concern for nocturnal seizures, waking up with tongue bite and stiff jaw. Lamotrigine ER dose increased to 200mg  BID in 02/2022. Last seizure in wakefulness was in 08/2021. He denies any further episodes of waking up with tongue bite since increase is dose. He denies any staring/unresponsive episodes, gaps in time, olfactory/gustatory hallucinations, focal numbness/tingling/weakness, myoclonic jerks. No headaches, dizziness, vision changes, no falls. Mood is good. He works night shift 3-4 times a week but gets good sleep, denies sleep deprivation. No side effects on medication.    History on Initial Assessment 01/26/2020: This is a pleasant 26 year old right-handed man presenting for second opinion regarding seizures. Records from Dr. 36 were reviewed. The first seizure occurred at age 26 right after graduation. he was preparing for a trip and woke up early at 4:30am, there were no warning symptoms, he woke up in the hospital with no memory of events. His mother had heard him fall and witnessed a GTC lasting 5 minutes. He was restless when EMS arrived, confused and disoriented, and brought to a hospital in [place]. He had an MRI and EEG. Per Dr. Aliciaberg note, EEG in June 2018 was apparently suggestive of generalized epilepsy with 2.5-3 Hz generalized spikes during hyperventilation, bifrontally predominant spike and wave in sleep, and a single left frontal spike. Abnormal discharges noted to be in runs up to 7-10  seconds. MRI brain reportedly unremarkable. He was given a prescription for Keppra which he did not take. He traveled to [place] 2018 with no issues. The next seizure occurred around 2 years later in December 2020, again around 4am. He recalls falling asleep on the couch, he woke up at 3am, took a shower, then went back to bed at 4am. Around 10 minutes of falling asleep, his roommate heard a loud groan and found him off the bed. There was no report of shaking, he had rolled over, stiff, and was not responding for 5 minutes. No tongue bite or incontinence, he recalls seeing EMS arrive and felt disoriented. His jaw hurt for a week after, no focal weakness. He reports that both occurred on days of very vigorous activity, the first episode occurred in the setting of sleep deprivation, he reports getting a full 8 hours prior to the second event, but was sleep deprived 2 nights prior. There was no alcohol with the first event, he had 1 beer 2 nights prior to the second one. No illicit substances. He denies any olfactory/gustatory hallucinations, deja vu, rising epigastric sensation, focal numbness/tingling/weakness, myoclonic jerks. He denies any headaches, dizziness, diplopia, dysarthria/dysphagia, neck/back pain, bowel/bladder dysfunction. He is a [birth year][employer][birth year][employer] in [employer] in [employer], memory is good.   Epilepsy Risk Factors:  His brother had a GTCs (febrile) at age 22. Otherwise he had a normal birth and early development.  There is no history of febrile convulsions, CNS infections such as meningitis/encephalitis, significant traumatic brain injury, neurosurgical procedures.  Diagnostic Data: MRI brain without contrast 2018: no acute changes, small benign pars intermedia cyst of the pituitary gland EEG in June 2018 was  apparently suggestive of generalized epilepsy with 2.5-3 Hz generalized spikes during hyperventilation, bifrontally predominant spike and wave in sleep, and a single left frontal spike. Abnormal discharges  noted to be in runs up to 7-10 seconds.  EEG in August 2021 abnormal due to occasional generalized high voltage 3-4 Hz spike and polyspike and wave discharges with frontal predominance lasting 1-3 seconds   PAST MEDICAL HISTORY: Past Medical History:  Diagnosis Date   Epilepsy ([facility]CC) 05/23/2017    MEDICATIONS: Current Outpatient Medications on File Prior to Visit  Medication Sig Dispense Refill   ibuprofen (ADVIL) 200 MG tablet Take 1,000 mg by mouth every 6 (six) hours as needed for mild pain.     LamoTRIgine 200 MG TB24 24 hour tablet TAKE 1 TABLET BY MOUTH TWICE A DAY 180 tablet 3   No current facility-administered medications on file prior to visit.    ALLERGIES: No Known Allergies  FAMILY HISTORY: Family History  Problem Relation Age of Onset   Coronary artery disease Father        triple bypass    SOCIAL HISTORY: Social History   Socioeconomic History   Marital status: Single    Spouse name: Not on file   Number of children: Not on file   Years of education: Not on file   Highest education level: Not on file  Occupational History    Comment: in college  Tobacco Use   Smoking status: Never   Smokeless tobacco: Never  Vaping Use   Vaping Use: Never used  Substance and Sexual Activity   Alcohol use: Not Currently    Comment: wine once a week   Drug use: No   Sexual activity: Not on file  Other Topics Concern   Not on file  Social History Narrative   Lives with sister   In college [employer]   No caffeine    No children   Right handed    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 No aphasia or dysarthria. Fund of knowledge is appropriate.  Attention and concentration are normal.   Cranial nerves: Pupils equal, round. Extraocular movements intact with no nystagmus. Visual fields full.  No facial asymmetry.  Motor: Bulk and tone normal, muscle strength 5/5 throughout with no pronator drift.   Finger to nose testing intact.  Gait narrow-based and steady, able to tandem walk adequately.  Romberg negative.   IMPRESSION: This is a pleasant 26 yo RH man with primary generalized epilepsy. He is doing well with no seizures in wakefulness since 08/2021. He called our office due to concern for nocturnal seizures in March 2023, none since increasing Lamotrigine ER dose to 200mg  BID. Baseline Lamictal level will be done at current dose. We again discussed avoidance of seizure triggers, including missing medication, alcohol, sleep deprivation. He is aware of [place] driving laws to stop driving after a seizure until 6 months seizure-free. Follow-up in 6 months, call for any changes.    Thank  you for allowing me to participate in his care.  Please do not hesitate to call for any questions or concerns.    Patrcia Dolly, M.D.   CC: Dr. Lewie Chamber

## 2022-06-24 NOTE — Patient Instructions (Signed)
Good to see you.   Have bloodwork done for Lamictal level  2. Continue Lamotrigine ER 200mg : take 1 tablet twice a day  3. Follow-up in 6 months, call for any changes   Seizure Precautions: 1. If medication has been prescribed for you to prevent seizures, take it exactly as directed.  Do not stop taking the medicine without talking to your doctor first, even if you have not had a seizure in a long time.   2. Avoid activities in which a seizure would cause danger to yourself or to others.  Don't operate dangerous machinery, swim alone, or climb in high or dangerous places, such as on ladders, roofs, or girders.  Do not drive unless your doctor says you may.  3. If you have any warning that you may have a seizure, lay down in a safe place where you can't hurt yourself.    4.  No driving for 6 months from last seizure, as per [facility].   Please refer to the following link on the Epilepsy Foundation of America's website for more information: http://www.epilepsyfoundation.org/answerplace/Social/driving/drivingu.cfm   5.  Maintain good sleep hygiene. Avoid alcohol.  6.  Contact your doctor if you have any problems that may be related to the medicine you are taking.  7.  Call 911 and bring the patient back to the ED if:        A.  The seizure lasts longer than 5 minutes.       B.  The patient doesn't awaken shortly after the seizure  C.  The patient has new problems such as difficulty seeing, speaking or moving  D.  The patient was injured during the seizure  E.  The patient has a temperature over 102 F (39C)  F.  The patient vomited and now is having trouble breathing

## 2022-07-19 ENCOUNTER — Other Ambulatory Visit: Payer: Self-pay

## 2022-07-19 ENCOUNTER — Other Ambulatory Visit (INDEPENDENT_AMBULATORY_CARE_PROVIDER_SITE_OTHER): Payer: Federal, State, Local not specified - PPO

## 2022-07-19 DIAGNOSIS — G40309 Generalized idiopathic epilepsy and epileptic syndromes, not intractable, without status epilepticus: Secondary | ICD-10-CM

## 2022-07-22 ENCOUNTER — Telehealth: Payer: Self-pay | Admitting: Neurology

## 2022-07-22 DIAGNOSIS — G40309 Generalized idiopathic epilepsy and epileptic syndromes, not intractable, without status epilepticus: Secondary | ICD-10-CM

## 2022-07-22 LAB — LAMOTRIGINE LEVEL: Lamotrigine Lvl: 5.6 ug/mL (ref 2.5–15.0)

## 2022-07-22 MED ORDER — LAMOTRIGINE ER 250 MG PO TB24
ORAL_TABLET | ORAL | 6 refills | Status: DC
Start: 2022-07-22 — End: 2022-08-13

## 2022-07-22 MED ORDER — LAMOTRIGINE ER 50 MG PO TB24: ORAL_TABLET | ORAL | 0 refills | Status: DC

## 2022-07-22 NOTE — Telephone Encounter (Signed)
He might get too dizzy if we increase to 300mg  or 400mg  BID, those are pretty higher doses. Rx sent for 50mg  tab to take with 200mg  tab for 250mg  BID. Thanks

## 2022-07-22 NOTE — Telephone Encounter (Signed)
Pt called in because he has his recently refilled his mediation he is asking at 1st if he could take 300mg  BID or 400 mg BID pt was told No that is not what the doctor wants him to take. Pt would like 28 tabs of 50 mg sent in to last until he has his blood work.

## 2022-07-22 NOTE — Telephone Encounter (Signed)
Pt called no answer left a voice mail per DPR we will increase the Lamotrigine ER to 250mg : Take 1 tablet twice a day. Dr sent in updated Rx. Pls have him re-check level in 2 weeks, lab order placed in epic

## 2022-07-22 NOTE — Telephone Encounter (Signed)
Patient called back to speak with Heather. °

## 2022-07-22 NOTE — Telephone Encounter (Signed)
Pt called informed He might get too dizzy if we increase to 300mg  or 400mg  BID, those are pretty higher doses. Rx sent for 50mg  tab to take with 200mg  tab for 250mg  BID

## 2022-07-22 NOTE — Telephone Encounter (Signed)
Pls let him know that we will increase the Lamotrigine ER to 250mg : Take 1 tablet twice a day. I sent in updated Rx. Pls have him re-check level in 2 weeks, thanks

## 2022-07-22 NOTE — Addendum Note (Signed)
Addended by: Dimas Chyle on: 07/22/2022 12:56 PM   Modules accepted: Orders

## 2022-07-22 NOTE — Telephone Encounter (Signed)
Patient called about his lamotrigine levels. He took another blood test after doubling the dose and the amount did not increase like it should have, he said.  Patient works nights, available before 9 AM today or after 3 PM.  Okay to leave a message and he will call back.

## 2022-07-31 DIAGNOSIS — Z23 Encounter for immunization: Secondary | ICD-10-CM | POA: Diagnosis not present

## 2022-08-01 ENCOUNTER — Other Ambulatory Visit (INDEPENDENT_AMBULATORY_CARE_PROVIDER_SITE_OTHER): Payer: Federal, State, Local not specified - PPO

## 2022-08-01 DIAGNOSIS — G40309 Generalized idiopathic epilepsy and epileptic syndromes, not intractable, without status epilepticus: Secondary | ICD-10-CM

## 2022-08-03 LAB — LAMOTRIGINE LEVEL: Lamotrigine Lvl: 7 ug/mL (ref 2.5–15.0)

## 2022-08-05 ENCOUNTER — Telehealth: Payer: Self-pay

## 2022-08-05 NOTE — Telephone Encounter (Signed)
Pt called an informed that  labs are within normal limits pt would like to get his labs up to a 10 asking if we can increase the lamotrigine 50 more Mg's? Pt was advised that Dr Karel Jarvis is out of the office an that I will call him bach when she returns

## 2022-08-05 NOTE — Telephone Encounter (Signed)
-----   Message from Glendale Chard, DO sent at 08/05/2022  9:10 AM EDT ----- Please notify patient lab are within normal limits.  Thank you.

## 2022-08-07 ENCOUNTER — Telehealth: Payer: Self-pay | Admitting: Neurology

## 2022-08-07 DIAGNOSIS — Z79899 Other long term (current) drug therapy: Secondary | ICD-10-CM

## 2022-08-07 NOTE — Telephone Encounter (Signed)
Pt called in stating his prescription is 250 mg of lamictal. He would like to see if he could get that raised to 300 mg based on his labs he just got back?

## 2022-08-13 MED ORDER — LAMOTRIGINE ER 300 MG PO TB24: ORAL_TABLET | ORAL | 5 refills | Status: DC

## 2022-08-13 NOTE — Addendum Note (Signed)
Addended by: Dimas Chyle on: 08/13/2022 01:41 PM   Modules accepted: Orders

## 2022-08-13 NOTE — Telephone Encounter (Signed)
See other phone note

## 2022-08-13 NOTE — Telephone Encounter (Signed)
Pt called an informed that Rx for Lamotrigine ER 300mg : take 1 tablet twice a day was sent to his pharmacy. Pt advised to  let know if he is too dizzy on this dose. We can recheck lamictal level in 2 weeks

## 2022-08-13 NOTE — Telephone Encounter (Signed)
Pls let him know Rx for Lamotrigine ER 300mg : take 1 tablet twice a day was sent to his pharmacy. Pls have him let know if he is too dizzy on this dose. We can recheck lamictal level in 2 weeks. Thanks

## 2022-09-06 ENCOUNTER — Other Ambulatory Visit: Payer: Self-pay | Admitting: Neurology

## 2022-10-14 DIAGNOSIS — Z13220 Encounter for screening for lipoid disorders: Secondary | ICD-10-CM | POA: Diagnosis not present

## 2022-10-14 DIAGNOSIS — Z0000 Encounter for general adult medical examination without abnormal findings: Secondary | ICD-10-CM | POA: Diagnosis not present

## 2022-10-14 DIAGNOSIS — G40909 Epilepsy, unspecified, not intractable, without status epilepticus: Secondary | ICD-10-CM | POA: Diagnosis not present

## 2022-10-16 ENCOUNTER — Other Ambulatory Visit (INDEPENDENT_AMBULATORY_CARE_PROVIDER_SITE_OTHER): Payer: Federal, State, Local not specified - PPO

## 2022-10-16 DIAGNOSIS — Z79899 Other long term (current) drug therapy: Secondary | ICD-10-CM | POA: Diagnosis not present

## 2022-10-18 LAB — LAMOTRIGINE LEVEL: Lamotrigine Lvl: 8.1 ug/mL (ref 2.5–15.0)

## 2022-10-28 ENCOUNTER — Telehealth: Payer: Self-pay

## 2022-10-28 NOTE — Telephone Encounter (Signed)
-----   Message from Cameron Sprang, MD sent at 10/27/2022  9:20 PM EST ----- Pls let him know Lamictal level is good right now, continue 300mg  BID, thanks

## 2022-10-28 NOTE — Telephone Encounter (Signed)
Pt called no answer left a voice mail per DPR that Lamictal level is good right now, continue 300mg  BID

## 2022-12-04 ENCOUNTER — Other Ambulatory Visit: Payer: Self-pay | Admitting: Neurology

## 2022-12-09 ENCOUNTER — Telehealth: Payer: Self-pay | Admitting: Neurology

## 2022-12-09 NOTE — Telephone Encounter (Signed)
Patient has 2 dosages left of his Lamotrigine and needs it called into the [facility] on [address]. He also has some questions about the medication please call

## 2022-12-09 NOTE — Telephone Encounter (Signed)
Pt called informed that new script was called in on 12/04/22 pt was asking for Dr Karel Jarvis to increase it to 350mg  so that his levels could go up closer to a 10? Pt was advised that Dr is not in the office until after Christmas and that he can talk to her about that at his appointment in January. When He had his lab work done last time Dr February told him to stay at current dose.

## 2023-01-03 ENCOUNTER — Other Ambulatory Visit: Payer: Self-pay | Admitting: Neurology

## 2023-01-10 NOTE — Telephone Encounter (Signed)
Discuss on f/u

## 2023-01-15 ENCOUNTER — Ambulatory Visit: Payer: Federal, State, Local not specified - PPO | Admitting: Neurology

## 2023-01-15 ENCOUNTER — Encounter: Payer: Self-pay | Admitting: Neurology

## 2023-01-15 VITALS — BP 142/83 | HR 71 | Ht 68.0 in | Wt 158.6 lb

## 2023-01-15 DIAGNOSIS — G40309 Generalized idiopathic epilepsy and epileptic syndromes, not intractable, without status epilepticus: Secondary | ICD-10-CM

## 2023-01-15 MED ORDER — LAMOTRIGINE ER 200 MG PO TB24: ORAL_TABLET | ORAL | 3 refills | Status: DC

## 2023-01-15 NOTE — Patient Instructions (Signed)
Good to see you. Wishing you all the best!  Increase Lamotrigine ER 200mg : take 2 tablets twice a day (400mg  twice a day)  2. Check Lamictal level in 2 weeks  3. Keep a calendar of your symptoms  4. Follow-up in 6 months, call for any changes   Seizure Precautions: 1. If medication has been prescribed for you to prevent seizures, take it exactly as directed.  Do not stop taking the medicine without talking to your doctor first, even if you have not had a seizure in a long time.   2. Avoid activities in which a seizure would cause danger to yourself or to others.  Don't operate dangerous machinery, swim alone, or climb in high or dangerous places, such as on ladders, roofs, or girders.  Do not drive unless your doctor says you may.  3. If you have any warning that you may have a seizure, lay down in a safe place where you can't hurt yourself.    4.  No driving for 6 months from last seizure, as per [facility].   Please refer to the following link on the [employer] website for more information: http://www.epilepsyfoundation.org/answerplace/Social/driving/drivingu.cfm   5.  Maintain good sleep hygiene. Avoid alcohol.  6.  Contact your doctor if you have any problems that may be related to the medicine you are taking.  7.  Call 911 and bring the patient back to the ED if:        A.  The seizure lasts longer than 5 minutes.       B.  The patient doesn't awaken shortly after the seizure  C.  The patient has new problems such as difficulty seeing, speaking or moving  D.  The patient was injured during the seizure  E.  The patient has a temperature over 102 F (39C)  F.  The patient vomited and now is having trouble breathing

## 2023-01-15 NOTE — Progress Notes (Unsigned)
NEUROLOGY FOLLOW UP OFFICE NOTE  Peter Shaw [id number] [birth year]  HISTORY OF PRESENT ILLNESS: I had the pleasure of seeing Peter Shaw in follow-up in the neurology clinic on 01/15/2023.  The patient was last seen 6 months ago for primary generalized epilepsy. He is alone in the office today. EEG in 07/2020 showed occasional generalized high voltage 3-4 Hz spike and polyspike and wave discharges with frontal predominance lasting 1-3 seconds. His last seizure in wakefulness was in 08/2021. Since his last visit, he reports 1 or 2 times where he woke up with his tongue bitten. He lives with roommates who have not mentioned any witnessed seizures. No incontinence. He wakes up with a funny metallic taste in his mouth and body feels sore. He thinks the last time this occurred was 3 months ago. He is currently on Lamotrigine 300mg  BID. Dose was increased in 07/2022 after Lamictal level of 7   1 or 2 times where he wakes up with tongue bitten twice in past 6 mos  Lives with roommates, no incont; funny taste in mouth, body feels sore, kind of like a metal taste Last was 3 months Happens when he wakes up too early Alcohol not trigger, no missed meds 1:30-10am;   7/23: 5.6 - inc to 250 8/23: 7 - inc to 300 10/23: 8.1 ROS: - Sz sx: - Mood good Planning on med school soon   He contacted our office in 01/2022 about concern for nocturnal seizures, waking up with tongue bite and stiff jaw. Lamotrigine ER dose increased to 200mg  BID in 02/2022. Last seizure in wakefulness was in 08/2021. He denies any further episodes of waking up with tongue bite since increase is dose. He denies any staring/unresponsive episodes, gaps in time, olfactory/gustatory hallucinations, focal numbness/tingling/weakness, myoclonic jerks. No headaches, dizziness, vision changes, no falls. Mood is good. He works night shift 3-4 times a week but gets good sleep, denies sleep deprivation. No side effects on medication.    History on  Initial Assessment 01/26/2020: This is a pleasant 27 year old right-handed man presenting for second opinion regarding seizures. Records from Dr. 03/25/2020 were reviewed. The first seizure occurred at age 15 right after graduation. he was preparing for a trip and woke up early at 4:30am, there were no warning symptoms, he woke up in the hospital with no memory of events. His mother had heard him fall and witnessed a GTC lasting 5 minutes. He was restless when EMS arrived, confused and disoriented, and brought to a hospital in [place]. He had an MRI and EEG. Per Dr. 15 note, EEG in June 2018 was apparently suggestive of generalized epilepsy with 2.5-3 Hz generalized spikes during hyperventilation, bifrontally predominant spike and wave in sleep, and a single left frontal spike. Abnormal discharges noted to be in runs up to 7-10 seconds. MRI brain reportedly unremarkable. He was given a prescription for Keppra which he did not take. He traveled to [place] with no issues. The next seizure occurred around 2 years later in December 2020, again around 4am. He recalls falling asleep on the couch, he woke up at 3am, took a shower, then went back to bed at 4am. Around 10 minutes of falling asleep, his roommate heard a loud groan and found him off the bed. There was no report of shaking, he had rolled over, stiff, and was not responding for 5 minutes. No tongue bite or incontinence, he recalls seeing EMS arrive and felt disoriented. His jaw hurt for a week after, no focal  weakness. He reports that both occurred on days of very vigorous activity, the first episode occurred in the setting of sleep deprivation, he reports getting a full 8 hours prior to the second event, but was sleep deprived 2 nights prior. There was no alcohol with the first event, he had 1 beer 2 nights prior to the second one. No illicit substances. He denies any olfactory/gustatory hallucinations, deja vu, rising epigastric sensation, focal  numbness/tingling/weakness, myoclonic jerks. He denies any headaches, dizziness, diplopia, dysarthria/dysphagia, neck/back pain, bowel/bladder dysfunction. 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   Epilepsy Risk Factors:  His brother had a GTCs (febrile) at age 25. Otherwise he had a normal birth and early development.  There is no history of febrile convulsions, CNS infections such as meningitis/encephalitis, significant traumatic brain injury, neurosurgical procedures.  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 Abnormal discharges noted to be in runs up to 7-10 seconds.  EEG in August 2021 abnormal due to occasional generalized high voltage 3-4 Hz spike and polyspike and wave discharges with frontal predominance lasting 1-3 seconds   PAST MEDICAL HISTORY: Past Medical History:  Diagnosis Date   Epilepsy ([place]) 05/23/2017    MEDICATIONS: Current Outpatient Medications on File Prior to Visit  Medication Sig Dispense Refill   LamoTRIgine 300 MG TB24 24 hour tablet TAKE 1 TABLET BY MOUTH TWICE A DAY 180 tablet 0   No current facility-administered medications on file prior to visit.    ALLERGIES: No Known Allergies  FAMILY HISTORY: Family History  Problem Relation Age of Onset   Coronary artery disease Father        triple bypass    SOCIAL HISTORY: Social History   Socioeconomic History   Marital status: Single    Spouse name: Not on file   Number of children: Not on file   Years of education: Not on file   Highest education level: Not on file  Occupational History    Comment: in college  Tobacco Use   Smoking status: Never   Smokeless tobacco: Never  Vaping Use    Vaping Use: Never used  Substance and Sexual Activity   Alcohol use: Not Currently    Comment: wine once a week   Drug use: No   Sexual activity: Not on file  Other Topics Concern   Not on file  Social History Narrative   Lives with sister   In college [employer]   No caffeine    No children   Right handed    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 No aphasia or dysarthria. Fund of knowledge is appropriate.  Recent and remote memory are intact.  Attention and concentration are  normal.   Cranial nerves: Pupils equal, round. Extraocular movements intact with no nystagmus. Visual fields full.  No facial asymmetry.  Motor: Bulk and tone normal, muscle strength 5/5 throughout with no pronator drift.   Finger to nose testing intact.  Gait narrow-based and steady, able to tandem walk adequately.  Romberg negative.   IMPRESSION: This is a pleasant 27 yo RH man with primary generalized epilepsy. He is doing well with no seizures in wakefulness since 08/2021. He called our office due to concern for nocturnal seizures in March 2023, none since increasing Lamotrigine ER dose to 200mg  BID. Baseline Lamictal level will be done at current dose. We again discussed avoidance of seizure triggers, including missing medication, alcohol, sleep deprivation. He is aware of [place] driving laws to stop driving after a seizure until 6 months seizure-free. Follow-up in 6 months, call for any changes.    Thank you  for allowing me to participate in *** care.  Please do not hesitate to call for any questions or concerns.  The duration of this appointment visit was *** minutes of face-to-face time with the patient.  Greater than 50% of this time was spent in counseling, explanation of diagnosis, planning of further management, and coordination of care.   Ellouise Newer, M.D.   CC: ***
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
## 2023-01-30 ENCOUNTER — Telehealth: Payer: Self-pay

## 2023-01-30 NOTE — Telephone Encounter (Signed)
Patient dropped off DMV paperwork that needs to be filled out by Dr.Aquino, and then faxed to [place] office once completed. Placed paperwork in the providers cubby.

## 2023-01-31 DIAGNOSIS — Z23 Encounter for immunization: Secondary | ICD-10-CM | POA: Diagnosis not present

## 2023-02-06 ENCOUNTER — Telehealth: Payer: Self-pay | Admitting: Neurology
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
## 2023-02-06 NOTE — Telephone Encounter (Signed)
Patient would like to speak to [facility] about some paperwork  please call

## 2023-02-06 NOTE — Telephone Encounter (Signed)
See other encounter pt called

## 2023-02-14 NOTE — Telephone Encounter (Signed)
Patient is calling in asking for a call back with the progress of the paperwork.
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
## 2023-02-18 ENCOUNTER — Other Ambulatory Visit (INDEPENDENT_AMBULATORY_CARE_PROVIDER_SITE_OTHER): Payer: Federal, State, Local not specified - PPO

## 2023-02-18 DIAGNOSIS — G40309 Generalized idiopathic epilepsy and epileptic syndromes, not intractable, without status epilepticus: Secondary | ICD-10-CM | POA: Diagnosis not present

## 2023-02-18 NOTE — Telephone Encounter (Signed)
Patient came into the office to pick up paperwork, said he got a call that everything had been filled out. Spoke with [name] who said she did not speak with him and that due to needing more information they couldn't fill it out until they got the clarification. Patient verbalized understanding and just asked for [place] to call him once completed.

## 2023-02-19 ENCOUNTER — Other Ambulatory Visit: Payer: Self-pay | Admitting: Neurology

## 2023-02-19 NOTE — Telephone Encounter (Signed)
Pt called an informed that his paperwork is ready he may come by and pick it up at his convenience

## 2023-02-19 NOTE — Telephone Encounter (Signed)
Paperwork ready for pickup, thanks

## 2023-02-20 LAB — LAMOTRIGINE LEVEL: Lamotrigine Lvl: 12.5 ug/mL (ref 2.5–15.0)

## 2023-02-25 ENCOUNTER — Telehealth: Payer: Self-pay

## 2023-02-25 NOTE — Telephone Encounter (Signed)
-----   Message from Cameron Sprang, MD sent at 02/21/2023 12:55 PM EST ----- Pls let him know Lamictal level is 12.5, continue current dose, thanks

## 2023-02-25 NOTE — Telephone Encounter (Signed)
Pt called no answer left a voice mail per DPR Lamictal level is 12.5, continue current dose,

## 2023-04-20 ENCOUNTER — Encounter (HOSPITAL_COMMUNITY): Payer: Self-pay

## 2023-04-20 ENCOUNTER — Emergency Department (HOSPITAL_COMMUNITY): Payer: Federal, State, Local not specified - PPO

## 2023-04-20 ENCOUNTER — Emergency Department (HOSPITAL_COMMUNITY)
Admission: EM | Admit: 2023-04-20 | Discharge: 2023-04-20 | Disposition: A | Discharge status: Home / Self Care | Payer: Federal, State, Local not specified - PPO | Attending: Emergency Medicine | Admitting: Emergency Medicine

## 2023-04-20 DIAGNOSIS — S8991XA Unspecified injury of right lower leg, initial encounter: Secondary | ICD-10-CM | POA: Diagnosis not present

## 2023-04-20 DIAGNOSIS — W19XXXA Unspecified fall, initial encounter: Secondary | ICD-10-CM | POA: Diagnosis not present

## 2023-04-20 DIAGNOSIS — S80212A Abrasion, left knee, initial encounter: Secondary | ICD-10-CM | POA: Insufficient documentation

## 2023-04-20 DIAGNOSIS — S40812A Abrasion of left upper arm, initial encounter: Secondary | ICD-10-CM | POA: Insufficient documentation

## 2023-04-20 DIAGNOSIS — S50311A Abrasion of right elbow, initial encounter: Secondary | ICD-10-CM | POA: Insufficient documentation

## 2023-04-20 DIAGNOSIS — S80211A Abrasion, right knee, initial encounter: Secondary | ICD-10-CM | POA: Insufficient documentation

## 2023-04-20 DIAGNOSIS — G40909 Epilepsy, unspecified, not intractable, without status epilepticus: Secondary | ICD-10-CM | POA: Diagnosis not present

## 2023-04-20 DIAGNOSIS — I10 Essential (primary) hypertension: Secondary | ICD-10-CM | POA: Diagnosis not present

## 2023-04-20 DIAGNOSIS — R569 Unspecified convulsions: Secondary | ICD-10-CM | POA: Diagnosis not present

## 2023-04-20 DIAGNOSIS — R000 Tachycardia, unspecified: Secondary | ICD-10-CM | POA: Diagnosis not present

## 2023-04-20 DIAGNOSIS — R55 Syncope and collapse: Secondary | ICD-10-CM | POA: Insufficient documentation

## 2023-04-20 LAB — BASIC METABOLIC PANEL
Anion gap: 11 (ref 5–15)
BUN: 12 mg/dL (ref 6–20)
CO2: 25 mmol/L (ref 22–32)
Calcium: 9.2 mg/dL (ref 8.9–10.3)
Chloride: 102 mmol/L (ref 98–111)
Creatinine, Ser: 0.87 mg/dL (ref 0.61–1.24)
GFR, Estimated: >60 mL/min (ref >60)
Glucose, Bld: 97 mg/dL (ref 70–99)
Potassium: 3.8 mmol/L (ref 3.5–5.1)
Sodium: 138 mmol/L (ref 135–145)

## 2023-04-20 LAB — CBC WITH DIFFERENTIAL/PLATELET
Abs Immature Granulocytes: 0.03 10*3/uL (ref 0.00–0.07)
Basophils Absolute: 0 10*3/uL (ref 0.0–0.1)
Basophils Relative: 0 %
Eosinophils Absolute: 0 10*3/uL (ref 0.0–0.5)
Eosinophils Relative: 0 %
HCT: 43.8 % (ref 39.0–52.0)
Hemoglobin: 14.6 g/dL (ref 13.0–17.0)
Immature Granulocytes: 0 %
Lymphocytes Relative: 8 %
Lymphs Abs: 0.6 10*3/uL — ABNORMAL LOW (ref 0.7–4.0)
MCH: 29.1 pg (ref 26.0–34.0)
MCHC: 33.3 g/dL (ref 30.0–36.0)
MCV: 87.3 fL (ref 80.0–100.0)
Monocytes Absolute: 0.5 10*3/uL (ref 0.1–1.0)
Monocytes Relative: 6 %
Neutro Abs: 6.9 10*3/uL (ref 1.7–7.7)
Neutrophils Relative %: 86 %
Platelets: 242 10*3/uL (ref 150–400)
RBC: 5.02 MIL/uL (ref 4.22–5.81)
RDW: 12.6 % (ref 11.5–15.5)
WBC: 8 10*3/uL (ref 4.0–10.5)
nRBC: 0 % (ref 0.0–0.2)

## 2023-04-20 LAB — CBG MONITORING, ED: Glucose-Capillary: 86 mg/dL (ref 70–99)

## 2023-04-20 LAB — MAGNESIUM: Magnesium: 2.5 mg/dL — ABNORMAL HIGH (ref 1.7–2.4)

## 2023-04-20 MED ORDER — LEVETIRACETAM IN NACL 1500 MG/100ML IV SOLN
1500.0000 mg | Freq: Once | INTRAVENOUS | Status: AC
  Administered 2023-04-20: 1500 mg via INTRAVENOUS
  Filled 2023-04-20: qty 100

## 2023-04-20 MED ORDER — LEVETIRACETAM 500 MG PO TABS: 500.0000 mg | ORAL_TABLET | Freq: Two times a day (BID) | ORAL | 1 refills | Status: DC

## 2023-04-20 NOTE — ED Triage Notes (Signed)
Pt arrives today c/o syncope vs seizure. Pt has hx of seizures, has been taking Lamotrigine as prescribed. Pt states his friend said he was out for approx 1 minute. Pt has scrapes on right knee and elbow. Pt is A&Ox 4 at this time without complaint.
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
## 2023-04-20 NOTE — Discharge Instructions (Signed)
Evaluation today revealed that you likely do have a breakthrough seizure.  I have started you on Keppra twice daily please take along with your prescribed Lamictal.  Also strongly recommend he follow-up with your neurologist as early as you can this week.  If you have another seizure, loss of consciousness, altered mental status, facial droop, slurred speech, severe headache or any other concerning symptom please return emergency department further evaluation.

## 2023-04-21 ENCOUNTER — Telehealth: Payer: Self-pay | Admitting: Neurology

## 2023-04-21 DIAGNOSIS — G40309 Generalized idiopathic epilepsy and epileptic syndromes, not intractable, without status epilepticus: Secondary | ICD-10-CM

## 2023-04-21 MED ORDER — LEVETIRACETAM 500 MG PO TABS: 500.0000 mg | ORAL_TABLET | Freq: Two times a day (BID) | ORAL | 3 refills | Status: DC

## 2023-04-21 NOTE — Telephone Encounter (Signed)
Pt called an informed that Dr Karel Jarvis received ER notes. Refills sent for Keppra 500mg  twice a day, continue Lamotrigine 200mg : 2 tabs BID. No driving until 6 months seizure-free pt verbalized understanding

## 2023-04-21 NOTE — Telephone Encounter (Signed)
F/u    The patient is asking for a call back from the nurse, no details given will explain when the nurse call back.
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
## 2023-04-21 NOTE — Telephone Encounter (Signed)
Pt called he stated that in 2022 he had an MRI ordered an never had it done he wanted to know if you would like for him to have that MRI done?

## 2023-04-21 NOTE — Telephone Encounter (Signed)
Pt c/o: seizure Missed medications?  No. Sleep deprived?  No. Alcohol intake?  No. Increased stress? No. Any change in medication color or shape? No. Back to their usual baseline self?  Yes.  . If no, advise go to ER Current medications prescribed by Dr. Karel Jarvis: lamotrigie 200mg   TAKE 2 TABLETS BY MOUTH TWICE A DAY  Hospital started him on leveiracetam 500mg  Take 1 tablet (500 mg total) by mouth 2 (two) times daily   Pt is asking if you would like for him to stay on the leveiracetam 500mg  Take 1 tablet (500 mg total) by mouth 2 (two) times daily he is waking up at night with muscle soreness and bites on his tongue, if you would like for him to stay  on the leveiracetam he is ask for you to call it in so its under your name and not the ER Dr Name

## 2023-04-21 NOTE — Telephone Encounter (Signed)
Pls let him know I received ER notes. Refills sent for Keppra 500mg  twice a day, continue Lamotrigine 200mg : 2 tabs BID. No driving until 6 months seizure-free. Thanks

## 2023-04-22 NOTE — Telephone Encounter (Signed)
Pt called no answer left a voice mail that we have reordered his MRI [facility] will be calling him to get it scheduled

## 2023-04-22 NOTE — Addendum Note (Signed)
Addended by: Dimas Chyle on: 04/22/2023 11:30 AM   Modules accepted: Orders

## 2023-04-22 NOTE — Telephone Encounter (Signed)
Ok to proceed with MRI brain with and without contrast for seizures. Thanks

## 2023-05-02 DIAGNOSIS — Z23 Encounter for immunization: Secondary | ICD-10-CM | POA: Diagnosis not present

## 2023-05-13 ENCOUNTER — Telehealth: Payer: Self-pay

## 2023-05-13 DIAGNOSIS — Z79899 Other long term (current) drug therapy: Secondary | ICD-10-CM

## 2023-05-13 NOTE — Telephone Encounter (Signed)
He is very concerned about levels. That is fine, ok for Keppra level. Thanks

## 2023-05-13 NOTE — Telephone Encounter (Signed)
Pt called informed that keppra level order is placed

## 2023-05-13 NOTE — Telephone Encounter (Signed)
Patient is inquiring about having his Keppra levels checked. He would like order placed.   Please advise, thanks!

## 2023-05-13 NOTE — Addendum Note (Signed)
Addended by: Dimas Chyle on: 05/13/2023 01:18 PM   Modules accepted: Orders

## 2023-05-20 ENCOUNTER — Telehealth: Payer: Self-pay | Admitting: Neurology

## 2023-05-20 NOTE — Telephone Encounter (Signed)
Pt came in inquiring about his FMLA paperwork.

## 2023-05-21 NOTE — Telephone Encounter (Signed)
Pls let him know this has been done already last week and faxed, thanks

## 2023-05-21 NOTE — Telephone Encounter (Signed)
Pt paperwork was faxed on the 22 and today the 5/29

## 2023-05-23 ENCOUNTER — Other Ambulatory Visit: Payer: Self-pay | Admitting: Neurology

## 2023-05-31 ENCOUNTER — Ambulatory Visit
Admission: RE | Admit: 2023-05-31 | Discharge: 2023-05-31 | Disposition: A | Discharge status: Home / Self Care | Payer: Federal, State, Local not specified - PPO | Source: Ambulatory Visit | Attending: Neurology | Admitting: Neurology

## 2023-05-31 DIAGNOSIS — G40309 Generalized idiopathic epilepsy and epileptic syndromes, not intractable, without status epilepticus: Secondary | ICD-10-CM | POA: Diagnosis not present

## 2023-05-31 MED ORDER — GADOPICLENOL 0.5 MMOL/ML IV SOLN
7.5000 mL | Freq: Once | INTRAVENOUS | Status: AC | PRN
  Administered 2023-05-31: 7.5 mL via INTRAVENOUS

## 2023-06-02 ENCOUNTER — Other Ambulatory Visit: Payer: Federal, State, Local not specified - PPO

## 2023-06-02 DIAGNOSIS — Z79899 Other long term (current) drug therapy: Secondary | ICD-10-CM

## 2023-06-07 LAB — LEVETIRACETAM LEVEL: Keppra (Levetiracetam): 5.8 ug/mL — ABNORMAL LOW

## 2023-06-11 ENCOUNTER — Telehealth: Payer: Self-pay | Admitting: Neurology

## 2023-06-11 NOTE — Telephone Encounter (Signed)
Keppra 500mg  asking if he can double his dose because his level is 5.8

## 2023-06-11 NOTE — Telephone Encounter (Signed)
Left message with the after hour service on 06-11-23 at 12:48 pm   Pt need to speak to someone about medication and dosage

## 2023-06-13 MED ORDER — LEVETIRACETAM 1000 MG PO TABS: 1000.0000 mg | ORAL_TABLET | Freq: Two times a day (BID) | ORAL | 3 refills | Status: DC

## 2023-06-13 NOTE — Telephone Encounter (Signed)
Pt called no answer left a voice mail to call the office back  °

## 2023-06-13 NOTE — Telephone Encounter (Signed)
Pls let him know I sent in Rx for Keppra 1000mg : take 1 tablet twice a day. Also, his brain MRI looked fine, no tumor, stroke, or bleed. Continue Lamotrigine in addition to Keppra. Thanks

## 2023-06-13 NOTE — Telephone Encounter (Signed)
Pt called an informed that Dr Karel Jarvis sent in Rx for Keppra 1000mg : take 1 tablet twice a day. Also, his brain MRI looked fine, no tumor, stroke, or bleed. Continue Lamotrigine in addition to Keppra

## 2023-07-16 ENCOUNTER — Telehealth: Payer: Self-pay | Admitting: Neurology

## 2023-07-16 NOTE — Telephone Encounter (Signed)
Patient called stating that the form that was faxed over to the [facility] wasn't filled out completely, DMV needs to know if the Doctor thinks the patient is safe to drive.Peter Shaw

## 2023-07-16 NOTE — Telephone Encounter (Signed)
Called pt and informed him Dr. Karel Jarvis was out of office for 2 weeks and that [facility] her nurse will be back in the office next Monday for question. I can't find any thing in his chart about it. He said he would call back on Monday to speak to [facility].

## 2023-07-29 NOTE — Telephone Encounter (Addendum)
Pt is calling to check the status of the below message and is aware that the provider just returned on 07/28/2023 and will address the message.  Pt is wanting to see if the provider can write a reevaluation letter stating that he is able to drive now and did not want to wait for the 08/13/2023 visit.  Pt stated that he had rec'd the letter in 05/2023 and they are needing the update by the end of July or early September.  Pt would like to have a call back if a letter can be done before his appt on 08/13/2023.

## 2023-07-30 NOTE — Telephone Encounter (Signed)
His last seizure on record is 04/20/23 when he was in the ER. The DMV will not let him drive until 6 months seizure-free. On the form, I write the date of last seizure, and that decision to drive is up to [facility] Medical Advisory Board. From my standpoint, I have to follow/recommend 6 month driving restriction, only DMV can say if it is otherwise. Thanks

## 2023-07-30 NOTE — Telephone Encounter (Signed)
Pt said that the [facility] needs a letter he had a seizure in April and that he can't drive until October.

## 2023-08-06 ENCOUNTER — Telehealth: Payer: Self-pay | Admitting: Neurology

## 2023-08-06 DIAGNOSIS — Z79899 Other long term (current) drug therapy: Secondary | ICD-10-CM

## 2023-08-06 NOTE — Telephone Encounter (Signed)
Caller stated he would like an order for keppra levels checked

## 2023-08-07 NOTE — Telephone Encounter (Signed)
Ok to check Keppra level, have done first thing in AM before morning dose (or end of day before lab closes). Thanks

## 2023-08-07 NOTE — Telephone Encounter (Signed)
Pt called informed that it ok to have Keppra level,  done first thing in AM before morning dose (or end of day before lab closes)

## 2023-08-07 NOTE — Addendum Note (Signed)
Addended by: Dimas Chyle on: 08/07/2023 10:56 AM   Modules accepted: Orders

## 2023-08-08 ENCOUNTER — Ambulatory Visit: Payer: Federal, State, Local not specified - PPO | Admitting: Neurology

## 2023-08-13 ENCOUNTER — Ambulatory Visit: Payer: Federal, State, Local not specified - PPO | Admitting: Neurology

## 2023-08-13 ENCOUNTER — Encounter: Payer: Self-pay | Admitting: Neurology

## 2023-08-13 VITALS — BP 128/75 | HR 84 | Ht 68.0 in | Wt 164.0 lb

## 2023-08-13 DIAGNOSIS — G40309 Generalized idiopathic epilepsy and epileptic syndromes, not intractable, without status epilepticus: Secondary | ICD-10-CM | POA: Diagnosis not present

## 2023-08-13 MED ORDER — LAMOTRIGINE ER 200 MG PO TB24: ORAL_TABLET | ORAL | 3 refills | Status: DC

## 2023-08-13 NOTE — Patient Instructions (Signed)
Good to see you. Wishing you all the best!  Have Keppra level checked  2. Continue all your medications  3. Once seizure-free for 6 months, send me a message on MyChart and we will get that letter ready for you  4. Follow-up in 6 months, call for any changes   Seizure Precautions: 1. If medication has been prescribed for you to prevent seizures, take it exactly as directed.  Do not stop taking the medicine without talking to your doctor first, even if you have not had a seizure in a long time.   2. Avoid activities in which a seizure would cause danger to yourself or to others.  Don't operate dangerous machinery, swim alone, or climb in high or dangerous places, such as on ladders, roofs, or girders.  Do not drive unless your doctor says you may.  3. If you have any warning that you may have a seizure, lay down in a safe place where you can't hurt yourself.    4.  No driving for 6 months from last seizure, as per [facility].   Please refer to the following link on the [employer]'s website for more information: http://www.epilepsyfoundation.org/answerplace/Social/driving/drivingu.cfm   5.  Maintain good sleep hygiene. Avoid alcohol.  6.  Contact your doctor if you have any problems that may be related to the medicine you are taking.  7.  Call 911 and bring the patient back to the ED if:        A.  The seizure lasts longer than 5 minutes.       B.  The patient doesn't awaken shortly after the seizure  C.  The patient has new problems such as difficulty seeing, speaking or moving  D.  The patient was injured during the seizure  E.  The patient has a temperature over 102 F (39C)  F.  The patient vomited and now is having trouble breathing

## 2023-08-13 NOTE — Progress Notes (Signed)
NEUROLOGY FOLLOW UP OFFICE NOTE  Peter Shaw [id number] 08-25-1996  HISTORY OF PRESENT ILLNESS: I had the pleasure of seeing Peter Shaw in follow-up in the neurology clinic on 08/13/2023.  The patient was last seen 7 months ago for Primary Generalized Epilepsy. He is alone in the office today.Records and images were personally reviewed where available. Lamictal level on 400mg  BID in 01/2023 was 12.5. Since his last visit, he was in the ER on 04/20/23 after a witnessed convulsion for 2 minutes. He was somnolent and disoriented after. He woke up in the ambulance. He scraped his right knee and elbow. No clear triggers, Levetiracetam 500mg  BID was added to his regimen. LEV level 5.8, patient requested to increase dose on 6/19, increased to 1000mg  BID. Brain MRI with and without contrast in 05/2023 was unremarkable, hippocampi symmetric. There was a small developmental venous anomaly within the anteromedial left frontal lobe (anatomic variant). He denies any seizures or seizure-like symptoms since 04/20/23. He denies any staring/unresponsive episodes, gaps in time, olfactory/gustatory hallucinations, focal numbness/tingling/weakness, myoclonic jerks. No headaches, dizziness, vision changes, no falls. No side effects on medications. He will be taking his exam next week. Sleep and mood are good.    History on Initial Assessment 01/26/2020: This is a pleasant 27 year old right-handed man presenting for second opinion regarding seizures. Records from Dr. Marjory Lies were reviewed. The first seizure occurred at age 27 right after graduation. he was preparing for a trip and woke up early at 4:30am, there were no warning symptoms, he woke up in the hospital with no memory of events. His mother had heard him fall and witnessed a GTC lasting 5 minutes. He was restless when EMS arrived, confused and disoriented, and brought to a hospital in [place]. He had an MRI and EEG. Per Dr. Richrd Humbles note, EEG in June 2018 was  apparently suggestive of generalized epilepsy with 2.5-3 Hz generalized spikes during hyperventilation, bifrontally predominant spike and wave in sleep, and a single left frontal spike. Abnormal discharges noted to be in runs up to 7-10 seconds. MRI brain reportedly unremarkable. He was given a prescription for Keppra which he did not take. He traveled to [place] with no issues. The next seizure occurred around 2 years later in December 2020, again around 4am. He recalls falling asleep on the couch, he woke up at 3am, took a shower, then went back to bed at 4am. Around 10 minutes of falling asleep, his roommate heard a loud groan and found him off the bed. There was no report of shaking, he had rolled over, stiff, and was not responding for 5 minutes. No tongue bite or incontinence, he recalls seeing EMS arrive and felt disoriented. His jaw hurt for a week after, no focal weakness. He reports that both occurred on days of very vigorous activity, the first episode occurred in the setting of sleep deprivation, he reports getting a full 8 hours prior to the second event, but was sleep deprived 2 nights prior. There was no alcohol with the first event, he had 1 beer 2 nights prior to the second one. No illicit substances. He denies any olfactory/gustatory hallucinations, deja vu, rising epigastric sensation, focal numbness/tingling/weakness, myoclonic jerks. He denies any headaches, dizziness, diplopia, dysarthria/dysphagia, neck/back pain, bowel/bladder dysfunction. 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   Epilepsy Risk Factors:  His brother had a GTCs (febrile) at age 35. Otherwise he had a normal birth and early development.  There is no history of  febrile convulsions, CNS infections such as meningitis/encephalitis, significant traumatic brain injury, neurosurgical procedures.  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 Abnormal discharges noted to be in runs up to 7-10 seconds.  EEG in August 2021 abnormal due to occasional generalized high voltage 3-4 Hz spike and polyspike and wave discharges with frontal predominance lasting 1-3 seconds  PAST MEDICAL HISTORY: Past Medical History:  Diagnosis Date   Epilepsy (HCC) 05/23/2017    MEDICATIONS: Current Outpatient Medications on File Prior to Visit  Medication Sig Dispense Refill   LamoTRIgine 200 MG TB24 24 hour tablet TAKE 2 TABLETS BY MOUTH TWICE A DAY 360 tablet 1   levETIRAcetam (KEPPRA) 1000 MG tablet Take 1 tablet (1,000 mg total) by mouth 2 (two) times daily. 180 tablet 3   No current facility-administered medications on file prior to visit.    ALLERGIES: No Known Allergies  FAMILY HISTORY: Family History  Problem Relation Age of Onset   Coronary artery disease Father        triple bypass    SOCIAL HISTORY: Social History   Socioeconomic History   Marital status: Single    Spouse name: Not on file   Number of children: Not on file   Years of education: Not on file   Highest education level: Not on file  Occupational History    Comment: in college  Tobacco Use   Smoking status: Never   Smokeless tobacco: Never  Vaping Use   Vaping status: Never Used  Substance and Sexual Activity   Alcohol use: Not Currently    Comment: wine once a week   Drug use: No   Sexual activity: Not on file  Other Topics Concern   Not on file  Social History Narrative   Lives with sister   In college [employer]   No caffeine    No children   Right handed    Lives in a complex with elevators   Social Determinants of Health   Financial Resource Strain: Not on file  Food Insecurity: Not on file  Transportation Needs: Not on file   Physical Activity: Not on file  Stress: Not on file  Social Connections: Not on file  Intimate Partner Violence: Not on file     PHYSICAL EXAM: Vitals:   08/13/23 1458  BP: 128/75  Pulse: 84  SpO2: 100%   General: No acute distress Head:  Normocephalic/atraumatic Skin/Extremities: No rash, no edema Neurological Exam: alert and awake. No aphasia or dysarthria. Fund of knowledge is appropriate.  Attention and concentration are normal.   Cranial nerves: Pupils equal, round. Extraocular movements intact with no nystagmus. Visual fields full.  No facial asymmetry.  Motor: Bulk and tone normal, muscle strength 5/5 throughout with no pronator drift.   Finger to nose testing intact.  Gait narrow-based and steady, able to tandem walk adequately.  Romberg negative.   IMPRESSION: This is a pleasant 27 yo RH man with primary generalized epilepsy. He had a breakthrough seizure on 04/20/23, now on 2 ASMs, Lamotrigine 400mg  BID (200mg  2 tabs BID) and Levetiracetam 1000mg  BID without side effects. He will be having Keppra level done soon. Continue seizure calendar, avoidance of seizure triggers. He is aware of [place] driving laws to stop driving after a  seizure until 6 months seizure-free. He states he needs a separate letter for the [facility] regarding his seizures and driving. Follow-up in 6 months, call for any changes.     Thank you for allowing me to participate in his care.  Please do not hesitate to call for any questions or concerns.    Patrcia Dolly, M.D.   CC: Dr. Lewie Chamber

## 2023-08-15 ENCOUNTER — Encounter: Payer: Self-pay | Admitting: Neurology

## 2023-08-18 ENCOUNTER — Other Ambulatory Visit (INDEPENDENT_AMBULATORY_CARE_PROVIDER_SITE_OTHER): Payer: Federal, State, Local not specified - PPO

## 2023-08-18 DIAGNOSIS — Z79899 Other long term (current) drug therapy: Secondary | ICD-10-CM

## 2023-08-22 LAB — LEVETIRACETAM LEVEL: Keppra (Levetiracetam): 11.8 ug/mL — ABNORMAL LOW

## 2023-09-08 ENCOUNTER — Telehealth: Payer: Self-pay

## 2023-09-08 NOTE — Telephone Encounter (Signed)
-----   Message from Van Clines sent at 09/01/2023 12:35 PM EDT ----- Pls let him know the Keppra level was 11.8, which is very close to reference 12-46. If he is not having seizures, would continue on the dose Keppra 1000mg  twice a day, in addition to high dose Lamictal 400mg  twice a day. It is important that we follow the patient (meaning being seizure-free), rather than following numbers. Thanks

## 2023-09-08 NOTE — Telephone Encounter (Signed)
Pt called an informed that Keppra level was 11.8, which is very close to reference 12-46. If he is not having seizures, would continue on the dose Keppra 1000mg  twice a day, in addition to high dose Lamictal 400mg  twice a day. It is important that we follow the patient (meaning being seizure-free), rather than following numbers. Pt last seizure was in April

## 2023-10-17 DIAGNOSIS — Z23 Encounter for immunization: Secondary | ICD-10-CM | POA: Diagnosis not present

## 2023-10-17 DIAGNOSIS — Z130 Encounter for screening for diseases of the blood and blood-forming organs and certain disorders involving the immune mechanism: Secondary | ICD-10-CM | POA: Diagnosis not present

## 2023-10-17 DIAGNOSIS — E7800 Pure hypercholesterolemia, unspecified: Secondary | ICD-10-CM | POA: Diagnosis not present

## 2023-10-17 DIAGNOSIS — G40909 Epilepsy, unspecified, not intractable, without status epilepticus: Secondary | ICD-10-CM | POA: Diagnosis not present

## 2023-10-17 DIAGNOSIS — Z1159 Encounter for screening for other viral diseases: Secondary | ICD-10-CM | POA: Diagnosis not present

## 2023-10-17 DIAGNOSIS — Z0000 Encounter for general adult medical examination without abnormal findings: Secondary | ICD-10-CM | POA: Diagnosis not present

## 2023-10-23 ENCOUNTER — Telehealth: Payer: Self-pay | Admitting: Neurology

## 2023-10-23 NOTE — Telephone Encounter (Signed)
Patient called and stated he needs an office letter head with Aquino's signature. Him and aquino talked about this in his last office visit.   It needs to states he has been seizure free for 6 months. 6 months would be 10/20/23.   He would like a call back

## 2023-10-30 ENCOUNTER — Telehealth: Payer: Self-pay

## 2023-10-30 ENCOUNTER — Encounter: Payer: Self-pay | Admitting: Neurology

## 2023-10-30 NOTE — Telephone Encounter (Signed)
Pt needs a letter that he is 6 month seizure free for the [facility] on a letter head, pt stated that he is seizure free his 6 moths was over is October,

## 2023-10-30 NOTE — Telephone Encounter (Signed)
Pt called an informed that letter was mailed in for him

## 2023-10-30 NOTE — Telephone Encounter (Signed)
Done, thanks

## 2023-11-24 DIAGNOSIS — R945 Abnormal results of liver function studies: Secondary | ICD-10-CM | POA: Diagnosis not present

## 2023-12-08 ENCOUNTER — Encounter: Payer: Self-pay | Admitting: Neurology

## 2023-12-10 NOTE — Telephone Encounter (Signed)
Pt called in wanting to follow up on this message.

## 2023-12-12 DIAGNOSIS — G40909 Epilepsy, unspecified, not intractable, without status epilepticus: Secondary | ICD-10-CM | POA: Diagnosis not present

## 2024-02-20 ENCOUNTER — Other Ambulatory Visit: Payer: Self-pay

## 2024-02-20 ENCOUNTER — Emergency Department (HOSPITAL_COMMUNITY): Payer: Federal, State, Local not specified - PPO

## 2024-02-20 ENCOUNTER — Encounter (HOSPITAL_COMMUNITY): Payer: Self-pay

## 2024-02-20 ENCOUNTER — Observation Stay (HOSPITAL_COMMUNITY)
Admission: EM | Admit: 2024-02-20 | Discharge: 2024-02-21 | Disposition: A | Discharge status: Home / Self Care | Payer: Federal, State, Local not specified - PPO | Attending: Internal Medicine | Admitting: Internal Medicine

## 2024-02-20 DIAGNOSIS — J329 Chronic sinusitis, unspecified: Secondary | ICD-10-CM | POA: Diagnosis not present

## 2024-02-20 DIAGNOSIS — Z79899 Other long term (current) drug therapy: Secondary | ICD-10-CM | POA: Diagnosis not present

## 2024-02-20 DIAGNOSIS — R7989 Other specified abnormal findings of blood chemistry: Secondary | ICD-10-CM

## 2024-02-20 DIAGNOSIS — N179 Acute kidney failure, unspecified: Secondary | ICD-10-CM | POA: Diagnosis not present

## 2024-02-20 DIAGNOSIS — Z1152 Encounter for screening for COVID-19: Secondary | ICD-10-CM | POA: Insufficient documentation

## 2024-02-20 DIAGNOSIS — G40309 Generalized idiopathic epilepsy and epileptic syndromes, not intractable, without status epilepticus: Secondary | ICD-10-CM | POA: Diagnosis present

## 2024-02-20 DIAGNOSIS — R945 Abnormal results of liver function studies: Secondary | ICD-10-CM | POA: Diagnosis not present

## 2024-02-20 DIAGNOSIS — G40919 Epilepsy, unspecified, intractable, without status epilepticus: Secondary | ICD-10-CM | POA: Diagnosis present

## 2024-02-20 DIAGNOSIS — R000 Tachycardia, unspecified: Secondary | ICD-10-CM | POA: Diagnosis not present

## 2024-02-20 DIAGNOSIS — R569 Unspecified convulsions: Secondary | ICD-10-CM | POA: Diagnosis not present

## 2024-02-20 DIAGNOSIS — R410 Disorientation, unspecified: Secondary | ICD-10-CM | POA: Diagnosis not present

## 2024-02-20 LAB — CBC WITH DIFFERENTIAL/PLATELET
Abs Immature Granulocytes: 0.06 10*3/uL (ref 0.00–0.07)
Basophils Absolute: 0 10*3/uL (ref 0.0–0.1)
Basophils Relative: 0 %
Eosinophils Absolute: 0 10*3/uL (ref 0.0–0.5)
Eosinophils Relative: 0 %
HCT: 52.3 % — ABNORMAL HIGH (ref 39.0–52.0)
Hemoglobin: 16.9 g/dL (ref 13.0–17.0)
Immature Granulocytes: 1 %
Lymphocytes Relative: 15 %
Lymphs Abs: 1.9 10*3/uL (ref 0.7–4.0)
MCH: 28.8 pg (ref 26.0–34.0)
MCHC: 32.3 g/dL (ref 30.0–36.0)
MCV: 89.2 fL (ref 80.0–100.0)
Monocytes Absolute: 1.1 10*3/uL — ABNORMAL HIGH (ref 0.1–1.0)
Monocytes Relative: 9 %
Neutro Abs: 9.8 10*3/uL — ABNORMAL HIGH (ref 1.7–7.7)
Neutrophils Relative %: 75 %
Platelets: 390 10*3/uL (ref 150–400)
RBC: 5.86 MIL/uL — ABNORMAL HIGH (ref 4.22–5.81)
RDW: 12.8 % (ref 11.5–15.5)
WBC: 13 10*3/uL — ABNORMAL HIGH (ref 4.0–10.5)
nRBC: 0 % (ref 0.0–0.2)

## 2024-02-20 LAB — RESP PANEL BY RT-PCR (RSV, FLU A&B, COVID)  RVPGX2
Influenza A by PCR: NEGATIVE
Influenza B by PCR: NEGATIVE
Resp Syncytial Virus by PCR: NEGATIVE
SARS Coronavirus 2 by RT PCR: NEGATIVE

## 2024-02-20 LAB — ETHANOL: Alcohol, Ethyl (B): <10 mg/dL (ref <10)

## 2024-02-20 LAB — COMPREHENSIVE METABOLIC PANEL
ALT: 53 U/L — ABNORMAL HIGH (ref 0–44)
AST: 75 U/L — ABNORMAL HIGH (ref 15–41)
Albumin: 5.1 g/dL — ABNORMAL HIGH (ref 3.5–5.0)
Alkaline Phosphatase: 111 U/L (ref 38–126)
Anion gap: 20 — ABNORMAL HIGH (ref 5–15)
BUN: 18 mg/dL (ref 6–20)
CO2: 17 mmol/L — ABNORMAL LOW (ref 22–32)
Calcium: 9.7 mg/dL (ref 8.9–10.3)
Chloride: 106 mmol/L (ref 98–111)
Creatinine, Ser: 1.34 mg/dL — ABNORMAL HIGH (ref 0.61–1.24)
GFR, Estimated: >60 mL/min (ref >60)
Glucose, Bld: 127 mg/dL — ABNORMAL HIGH (ref 70–99)
Potassium: 3.6 mmol/L (ref 3.5–5.1)
Sodium: 143 mmol/L (ref 135–145)
Total Bilirubin: 0.9 mg/dL (ref 0.0–1.2)
Total Protein: 8.3 g/dL — ABNORMAL HIGH (ref 6.5–8.1)

## 2024-02-20 LAB — CBG MONITORING, ED: Glucose-Capillary: 130 mg/dL — ABNORMAL HIGH (ref 70–99)

## 2024-02-20 LAB — I-STAT CHEM 8, ED
BUN: 20 mg/dL (ref 6–20)
Calcium, Ion: 1.07 mmol/L — ABNORMAL LOW (ref 1.15–1.40)
Chloride: 112 mmol/L — ABNORMAL HIGH (ref 98–111)
Creatinine, Ser: 1.1 mg/dL (ref 0.61–1.24)
Glucose, Bld: 130 mg/dL — ABNORMAL HIGH (ref 70–99)
HCT: 51 % (ref 39.0–52.0)
Hemoglobin: 17.3 g/dL — ABNORMAL HIGH (ref 13.0–17.0)
Potassium: 3.5 mmol/L (ref 3.5–5.1)
Sodium: 142 mmol/L (ref 135–145)
TCO2: 14 mmol/L — ABNORMAL LOW (ref 22–32)

## 2024-02-20 MED ORDER — LAMOTRIGINE 100 MG PO TABS
400.0000 mg | ORAL_TABLET | Freq: Two times a day (BID) | ORAL | Status: DC
  Administered 2024-02-20 – 2024-02-21 (×2): 400 mg via ORAL
  Filled 2024-02-20: qty 1
  Filled 2024-02-20: qty 4
  Filled 2024-02-20: qty 1

## 2024-02-20 MED ORDER — LACTATED RINGERS IV BOLUS
1000.0000 mL | Freq: Once | INTRAVENOUS | Status: AC
  Administered 2024-02-20: 1000 mL via INTRAVENOUS

## 2024-02-20 MED ORDER — LEVETIRACETAM IN NACL 1500 MG/100ML IV SOLN
1500.0000 mg | Freq: Once | INTRAVENOUS | Status: DC
  Filled 2024-02-20: qty 100

## 2024-02-20 MED ORDER — LORAZEPAM 2 MG/ML IJ SOLN
1.0000 mg | Freq: Once | INTRAMUSCULAR | Status: AC
  Administered 2024-02-20: 1 mg via INTRAVENOUS
  Filled 2024-02-20: qty 1

## 2024-02-20 MED ORDER — SODIUM CHLORIDE 0.9 % IV SOLN: 400.0000 mg | Freq: Once | INTRAVENOUS | Status: DC

## 2024-02-20 MED ORDER — SODIUM CHLORIDE 0.9 % IV SOLN
200.0000 mg | Freq: Once | INTRAVENOUS | Status: AC
  Administered 2024-02-20: 200 mg via INTRAVENOUS
  Filled 2024-02-20: qty 20

## 2024-02-20 MED ORDER — ONDANSETRON HCL 4 MG/2ML IJ SOLN
4.0000 mg | Freq: Once | INTRAMUSCULAR | Status: AC
  Administered 2024-02-20: 4 mg via INTRAVENOUS
  Filled 2024-02-20: qty 2

## 2024-02-20 MED ORDER — LACOSAMIDE 50 MG PO TABS
100.0000 mg | ORAL_TABLET | Freq: Two times a day (BID) | ORAL | Status: DC
  Filled 2024-02-20: qty 2

## 2024-02-20 MED ORDER — LEVETIRACETAM IN NACL 1500 MG/100ML IV SOLN
1500.0000 mg | Freq: Once | INTRAVENOUS | Status: AC
  Administered 2024-02-20: 1500 mg via INTRAVENOUS
  Filled 2024-02-20: qty 100

## 2024-02-20 MED ORDER — LEVETIRACETAM 500 MG PO TABS
1000.0000 mg | ORAL_TABLET | Freq: Three times a day (TID) | ORAL | Status: DC
  Administered 2024-02-20: 1000 mg via ORAL
  Filled 2024-02-20: qty 2

## 2024-02-20 NOTE — Consult Note (Addendum)
 NEUROLOGY CONSULT NOTE   Date of service: February 20, 2024 Patient Name: Peter Shaw MRN:  [id number] DOB:  [birth year] Chief Complaint: "seizures" Requesting Provider: Alvira Monday, MD  History of Present Illness  Peter Shaw is a 29 y.o. male with hx of primary generalized epilepsy on Keppra and lamictal who had 2 witnessed seizures at home and another with EMS. He is compliant with his Keppra and Lamictal. He was observed in the ED and was bring readied for discharge when he had another witnessed seizure.  No clear trigger for his seizure. He reports brakthrough seizures every 4-5 months. Last one was in April of last year.    ROS  Comprehensive ROS performed and pertinent positives documented in HPI   Past History   Past Medical History:  Diagnosis Date   Epilepsy (HCC) 05/23/2017    Past Surgical History:  Procedure Laterality Date   CIRCUMCISION      Family History: Family History  Problem Relation Age of Onset   Coronary artery disease Father        triple bypass    Social History  reports that he has never smoked. He has never used smokeless tobacco. He reports that he does not currently use alcohol. He reports that he does not use drugs.  No Known Allergies  Medications   Current Facility-Administered Medications:    lacosamide (VIMPAT) 200 mg in sodium chloride 0.9 % 25 mL IVPB, 200 mg, Intravenous, Once, Alvira Monday, MD   lacosamide (VIMPAT) tablet 100 mg, 100 mg, Oral, BID, Erick Blinks, MD  Current Outpatient Medications:    LamoTRIgine 200 MG TB24 24 hour tablet, TAKE 2 TABLETS BY MOUTH TWICE A DAY, Disp: 360 tablet, Rfl: 3   levETIRAcetam (KEPPRA) 1000 MG tablet, Take 1 tablet (1,000 mg total) by mouth 2 (two) times daily., Disp: 180 tablet, Rfl: 3  Vitals   Vitals:   02/20/24 1716 02/20/24 1806 02/20/24 1830 02/20/24 2053  BP:  112/78 101/71 131/84  Pulse:  97 84 (!) 104  Resp:  (!) 25 17 17   Temp: 98.1 F (36.7 C) 98.2 F  (36.8 C)  98.3 F (36.8 C)  TempSrc: Oral   Oral  SpO2:  98% 97% 96%  Weight:        Body mass index is 24.33 kg/m.  Physical Exam   General: Laying comfortably in bed; in no acute distress.  HENT: Normal oropharynx and mucosa. Normal external appearance of ears and nose. Bumpe on his R forehead. Neck: Supple, no pain or tenderness  CV: No JVD. No peripheral edema.  Pulmonary: Symmetric Chest rise. Normal respiratory effort.  Abdomen: Soft to touch, non-tender.  Ext: No cyanosis, edema, or deformity  Skin: No rash. Normal palpation of skin.   Musculoskeletal: Normal digits and nails by inspection. No clubbing.   Neurologic Examination  Mental status/Cognition: drowsy, oriented to self, place, month and year, good attention.  Speech/language: Fluent, comprehension intact, object naming intact, repetition intact.  Cranial nerves:   CN II Pupils equal and reactive to light, no VF deficits    CN III,IV,VI EOM intact, no gaze preference or deviation, no nystagmus    CN V normal sensation in V1, V2, and V3 segments bilaterally    CN VII no asymmetry, no nasolabial fold flattening    CN VIII normal hearing to speech    CN IX & X normal palatal elevation, no uvular deviation    CN XI 5/5 head turn and 5/5 shoulder shrug bilaterally  CN XII midline tongue protrusion    Motor:  Muscle bulk: normal, tone normal, pronator drift none tremor none Mvmt Root Nerve  Muscle Right Left Comments  SA C5/6 Ax Deltoid 5 5   EF C5/6 Mc Biceps 5 5   EE C6/7/8 Rad Triceps 5 5   WF C6/7 Med FCR     WE C7/8 PIN ECU     F Ab C8/T1 U ADM/FDI 5 5   HF L1/2/3 Fem Illopsoas 5 5   KE L2/3/4 Fem Quad 5 5   DF L4/5 D Peron Tib Ant 5 5   PF S1/2 Tibial Grc/Sol 5 5    Sensation:  Light touch Intact throughout   Pin prick    Temperature    Vibration   Proprioception    Coordination/Complex Motor:  - Finger to Nose intact BL - Heel to shin intact BL - Rapid alternating movement are normal -  Gait: deferred.  Labs/Imaging/Neurodiagnostic studies   CBC:  Recent Labs  Lab 03/02/2024 2100  HGB 17.3*  HCT 51.0   Basic Metabolic Panel:  Lab Results  Component Value Date   NA 142 03-02-2024   K 3.5 Mar 02, 2024   CO2 25 04/20/2023   GLUCOSE 130 (H) March 02, 2024   BUN 20 02-Mar-2024   CREATININE 1.10 2024/03/02   CALCIUM 9.2 04/20/2023   GFRNONAA >60 04/20/2023   Lipid Panel: No results found for: "LDLCALC" HgbA1c: No results found for: "HGBA1C" Urine Drug Screen: No results found for: "LABOPIA", "COCAINSCRNUR", "LABBENZ", "AMPHETMU", "THCU", "LABBARB"  Alcohol Level     Component Value Date/Time   ETH <10 07/29/2021 0926   INR No results found for: "INR" APTT No results found for: "APTT" AED levels:  Lab Results  Component Value Date   LAMOTRIGINE 12.5 02/18/2023    CT Head without contrast(Personally reviewed): Pending  MRI Brain from 05/31/23: No acute abnormalities.  ASSESSMENT   Peter Shaw is a 28 y.o. male with hx of primary generalized epilepsy on Keppra and lamictal who had 2 witnessed seizures at home and another with EMS and 2 more in the ED. This is concerning for seizure clustering. He is compliant with his medications and takes Keppra 1000mg  TID and Lamictal 400mg  BID.  RECOMMENDATIONS  - Will give him Ativan 1mg  once IV now. - no need to get MRI or rEEG. - observe him overnight for seizure clustering. He can be discharged in AM if he has no further seizures. - follow up with Dr. Karel Jarvis with [facility]. - No driving for 6 months. Has to be seizure free, before he can resume driving.  Update 2:53 AM: After discussion with patient again, he reports taking Keppra 1000mg  in AM and 1000mg  in PM instead of 1000mg  TID that is documented in notes and that has been prescribed to him. He is hesitant about going up on his AEDs. Will do Keppra 1000mg  in AM and 1500mg  at bedtime. Will discontinue Vimpat and continue Lamotrigine at home dose of 400mg   BID. - Keppra 1000mg  in AM and Keppra 1500mg  in PM - Lamotrigine 400mg  BID - Stop any further Vimpat.  ______________________________________________________________________  Welton Flakes, MD [facility]

## 2024-02-20 NOTE — ED Provider Notes (Signed)
  EMERGENCY DEPARTMENT AT [facility] Provider Note   CSN: [id number] Arrival date & time: 02/20/24  1645     History  Chief Complaint  Patient presents with   Seizures    Peter Shaw is a 28 y.o. male.  HPI     28yo male with history of epilepsy presents with concern for seizure.  He does not remember what happened.  He believes his roommate likely called.  Per EMS he had 2 witnessed seizures.  He had had 1 at home prior to being called and then with EMS had another seizure.  He was not given any medications prior to arrival.  He appeared postictal after.  Denies any recent symptoms including no fever, cough, congestion, urinary symptoms, abdominal pain, nausea, vomiting, headache, numbness, weakness, change in vision difficulty talking.  He has not tried to walk since he had the seizure.  He does not believe he had a fall or hit his head.  He reports he has been taking his seizure medication as prescribed both Keppra and Lamictal and that he took it this morning.  Denies any smoking, alcohol or drug use.  He reports that every 4 months he will have another breakthrough seizure, although his last seizure was April 2024.  History of breakthrough seizure last April 2024, on Lamictal 400 mg twice daily and Keppra 1000 mg twice daily Sees Dr. Karel Jarvis Works here at [facility]     Past Medical History:  Diagnosis Date   Epilepsy (HCC) 05/23/2017    Home Medications Prior to Admission medications   Medication Sig Start Date End Date Taking? Authorizing Provider  LamoTRIgine 200 MG TB24 24 hour tablet TAKE 2 TABLETS BY MOUTH TWICE A DAY 08/13/23  Yes Van Clines, MD  levETIRAcetam (KEPPRA) 1000 MG tablet Take 1 tablet (1,000 mg total) by mouth 2 (two) times daily. 06/13/23  Yes Van Clines, MD      Allergies    Patient has no known allergies.    Review of Systems   Review of Systems  Physical Exam Updated Vital Signs BP (!) 120/91 (BP Location: Right Arm)    Pulse (!) 102   Temp 98.5 F (36.9 C) (Oral)   Resp 17   Ht 5\' 8"  (1.727 m)   Wt 75.3 kg   SpO2 96%   BMI 25.24 kg/m  Physical Exam Vitals and nursing note reviewed.  Constitutional:      General: He is not in acute distress.    Appearance: Normal appearance. He is well-developed. He is not ill-appearing or diaphoretic.  HENT:     Head: Normocephalic and atraumatic.     Comments: Superficial intraoral laceration Eyes:     General: No visual field deficit.    Extraocular Movements: Extraocular movements intact.     Conjunctiva/sclera: Conjunctivae normal.     Pupils: Pupils are equal, round, and reactive to light.  Cardiovascular:     Rate and Rhythm: Normal rate and regular rhythm.     Pulses: Normal pulses.     Heart sounds: Normal heart sounds. No murmur heard.    No friction rub. No gallop.  Pulmonary:     Effort: Pulmonary effort is normal. No respiratory distress.     Breath sounds: Normal breath sounds. No wheezing or rales.  Abdominal:     General: There is no distension.     Palpations: Abdomen is soft.     Tenderness: There is no abdominal tenderness. There is no guarding.  Musculoskeletal:        General: No swelling or tenderness.     Cervical back: Normal range of motion.  Skin:    General: Skin is warm and dry.     Findings: No erythema or rash.  Neurological:     General: No focal deficit present.     Mental Status: He is alert and oriented to person, place, and time.     GCS: GCS eye subscore is 4. GCS verbal subscore is 5. GCS motor subscore is 6.     Cranial Nerves: No cranial nerve deficit, dysarthria or facial asymmetry.     Sensory: No sensory deficit.     Motor: No weakness or tremor.     Coordination: Coordination normal. Finger-Nose-Finger Test normal.     Gait: Gait normal.     ED Results / Procedures / Treatments   Labs (all labs ordered are listed, but only abnormal results are displayed) Labs Reviewed  CBC WITH DIFFERENTIAL/PLATELET -  Abnormal; Notable for the following components:      Result Value   WBC 13.0 (*)    RBC 5.86 (*)    HCT 52.3 (*)    Neutro Abs 9.8 (*)    Monocytes Absolute 1.1 (*)    All other components within normal limits  COMPREHENSIVE METABOLIC PANEL - Abnormal; Notable for the following components:   CO2 17 (*)    Glucose, Bld 127 (*)    Creatinine, Ser 1.34 (*)    Total Protein 8.3 (*)    Albumin 5.1 (*)    AST 75 (*)    ALT 53 (*)    Anion gap 20 (*)    All other components within normal limits  COMPREHENSIVE METABOLIC PANEL - Abnormal; Notable for the following components:   Glucose, Bld 103 (*)    AST 51 (*)    All other components within normal limits  CBC WITH DIFFERENTIAL/PLATELET - Abnormal; Notable for the following components:   Lymphs Abs 0.6 (*)    All other components within normal limits  CBG MONITORING, ED - Abnormal; Notable for the following components:   Glucose-Capillary 130 (*)    All other components within normal limits  I-STAT CHEM 8, ED - Abnormal; Notable for the following components:   Chloride 112 (*)    Glucose, Bld 130 (*)    Calcium, Ion 1.07 (*)    TCO2 14 (*)    Hemoglobin 17.3 (*)    All other components within normal limits  RESP PANEL BY RT-PCR (RSV, FLU A&B, COVID)  RVPGX2  ETHANOL  HIV ANTIBODY (ROUTINE TESTING W REFLEX)  MAGNESIUM  HEPATITIS PANEL, ACUTE  LEVETIRACETAM LEVEL  LAMOTRIGINE LEVEL  RAPID URINE DRUG SCREEN, HOSP PERFORMED  URINALYSIS, W/ REFLEX TO CULTURE (INFECTION SUSPECTED)  CBC    EKG EKG Interpretation Date/Time:  Friday February 20 2024 16:50:57 EST Ventricular Rate:  113 PR Interval:  151 QRS Duration:  94 QT Interval:  320 QTC Calculation: 439 R Axis:   99  Text Interpretation: Sinus tachycardia Consider right atrial enlargement Borderline right axis deviation No significant change since last tracing Confirmed by Alvira Monday ([id number]) on 02/20/2024 5:15:49 PM  Radiology CT Head Wo Contrast Result Date:  02/20/2024 CLINICAL DATA:  Sinusitis, acute, orbital or intracranial complication suspected EXAM: CT HEAD WITHOUT CONTRAST TECHNIQUE: Contiguous axial images were obtained from the base of the skull through the vertex without intravenous contrast. RADIATION DOSE REDUCTION: This exam was performed according to the departmental dose-optimization program  which includes automated exposure control, adjustment of the mA and/or kV according to patient size and/or use of iterative reconstruction technique. COMPARISON:  CT head 07/29/2021 and MRI head 05/31/2023 FINDINGS: Brain: No intracranial hemorrhage, mass effect, or evidence of acute infarct. No hydrocephalus. No extra-axial fluid collection. Vascular: No hyperdense vessel or unexpected calcification. Skull: No fracture or focal lesion. Sinuses/Orbits: Paranasal sinuses are well aerated. No mastoid effusion. Normal orbits. Other: None. IMPRESSION: Normal noncontrast CT of the head. Electronically Signed   By: Minerva Fester M.D.   On: 02/20/2024 21:58    Procedures Procedures    Medications Ordered in ED Medications  lamoTRIgine (LAMICTAL) tablet 400 mg (400 mg Oral Given 02/20/24 2331)  enoxaparin (LOVENOX) injection 40 mg (has no administration in time range)  levETIRAcetam (KEPPRA) tablet 1,000 mg (1,000 mg Oral Not Given 02/21/24 0755)    And  levETIRAcetam (KEPPRA) tablet 1,500 mg (has no administration in time range)  lactated ringers infusion ( Intravenous New Bag/Given 02/21/24 0534)  promethazine (PHENERGAN) tablet 25 mg (has no administration in time range)  levETIRAcetam (KEPPRA) IVPB 1500 mg/ 100 mL premix (0 mg Intravenous Stopped 02/20/24 1742)  ondansetron (ZOFRAN) injection 4 mg (4 mg Intravenous Given 02/20/24 2047)  lactated ringers bolus 1,000 mL (0 mLs Intravenous Stopped 02/20/24 2123)  lacosamide (VIMPAT) 200 mg in sodium chloride 0.9 % 25 mL IVPB (0 mg Intravenous Stopped 02/20/24 2203)  LORazepam (ATIVAN) injection 1 mg (1 mg  Intravenous Given 02/20/24 2134)    ED Course/ Medical Decision Making/ A&P                                  27yo male with history of epilepsy presents with concern for seizure.  EKG without acute abnormality. CBG WNL  Unclear trigger for breakthrough seizure---denies drug use, viral symptoms, sleep deprivation, missed medication doses.  Reports this just happens every once in a while.  Declines further work up with labs in the ED---clinically, do not suspect significant electrolyte abnormality as etiology of seizures, low suspicion for clinically significant anemia given he does not have history of other medical problems or symptoms to suggest alterations in these.  No history of trauma, normal neurologic exam, do not feel that head CT indicated at this time in setting of known seizure disorder.  Did order low levels of his Keppra and Lamictal which are pending.  He was given 1500 mg of Keppra in the emergency department.  Will continue to observe--at this time is fatigued/postictal however mentating normally.    Returned to baseline and was asking nursing about going home-IV removed and he called his ride. Unfortunately after the plan for discharge he had another unwitnessed seizure with fall in his room and was found on the floor with hematoma on head.    CT head ordered and shows no sign of ICH.  Labs obtained at this time show no clinically significant electrolyte abnormalities nor anemia.   Discussed with Dr. Derry Lory.  He came to bedside for evaluation.  Ordered vimpat, ativan. Will admit for observation given 3 breakthrough seizures.          Final Clinical Impression(s) / ED Diagnoses Final diagnoses:  Seizure [facility])    Rx / DC Orders ED Discharge Orders     None         Alvira Monday, MD 02/21/24 1104

## 2024-02-20 NOTE — ED Triage Notes (Signed)
 Pt bib ems from home; pt had 2 witnessed seizures; on ems arrival pt a and o initially; pt had another seizure with ems; post ictal with ems; initial 12 lead unremarkable; vss; compliant with keppra; states he has multiple seizures every 3-4 months; no iv, no meds given pta; pt answering questions appropriately at present

## 2024-02-21 ENCOUNTER — Other Ambulatory Visit (HOSPITAL_COMMUNITY)

## 2024-02-21 ENCOUNTER — Encounter (HOSPITAL_COMMUNITY): Payer: Self-pay | Admitting: Internal Medicine

## 2024-02-21 DIAGNOSIS — G40919 Epilepsy, unspecified, intractable, without status epilepticus: Secondary | ICD-10-CM

## 2024-02-21 DIAGNOSIS — R7989 Other specified abnormal findings of blood chemistry: Secondary | ICD-10-CM

## 2024-02-21 DIAGNOSIS — R569 Unspecified convulsions: Secondary | ICD-10-CM | POA: Diagnosis not present

## 2024-02-21 DIAGNOSIS — N179 Acute kidney failure, unspecified: Secondary | ICD-10-CM | POA: Diagnosis not present

## 2024-02-21 DIAGNOSIS — G40309 Generalized idiopathic epilepsy and epileptic syndromes, not intractable, without status epilepticus: Secondary | ICD-10-CM

## 2024-02-21 LAB — CBC WITH DIFFERENTIAL/PLATELET
Abs Immature Granulocytes: 0.01 10*3/uL (ref 0.00–0.07)
Basophils Absolute: 0 10*3/uL (ref 0.0–0.1)
Basophils Relative: 0 %
Eosinophils Absolute: 0 10*3/uL (ref 0.0–0.5)
Eosinophils Relative: 0 %
HCT: 43.5 % (ref 39.0–52.0)
Hemoglobin: 14.7 g/dL (ref 13.0–17.0)
Immature Granulocytes: 0 %
Lymphocytes Relative: 9 %
Lymphs Abs: 0.6 10*3/uL — ABNORMAL LOW (ref 0.7–4.0)
MCH: 28.9 pg (ref 26.0–34.0)
MCHC: 33.8 g/dL (ref 30.0–36.0)
MCV: 85.6 fL (ref 80.0–100.0)
Monocytes Absolute: 0.7 10*3/uL (ref 0.1–1.0)
Monocytes Relative: 9 %
Neutro Abs: 5.8 10*3/uL (ref 1.7–7.7)
Neutrophils Relative %: 82 %
Platelets: 280 10*3/uL (ref 150–400)
RBC: 5.08 MIL/uL (ref 4.22–5.81)
RDW: 12.7 % (ref 11.5–15.5)
WBC: 7.1 10*3/uL (ref 4.0–10.5)
nRBC: 0 % (ref 0.0–0.2)

## 2024-02-21 LAB — COMPREHENSIVE METABOLIC PANEL
ALT: 42 U/L (ref 0–44)
AST: 51 U/L — ABNORMAL HIGH (ref 15–41)
Albumin: 4.2 g/dL (ref 3.5–5.0)
Alkaline Phosphatase: 89 U/L (ref 38–126)
Anion gap: 10 (ref 5–15)
BUN: 13 mg/dL (ref 6–20)
CO2: 23 mmol/L (ref 22–32)
Calcium: 9.1 mg/dL (ref 8.9–10.3)
Chloride: 104 mmol/L (ref 98–111)
Creatinine, Ser: 0.94 mg/dL (ref 0.61–1.24)
GFR, Estimated: >60 mL/min (ref >60)
Glucose, Bld: 103 mg/dL — ABNORMAL HIGH (ref 70–99)
Potassium: 3.8 mmol/L (ref 3.5–5.1)
Sodium: 137 mmol/L (ref 135–145)
Total Bilirubin: 0.9 mg/dL (ref 0.0–1.2)
Total Protein: 6.9 g/dL (ref 6.5–8.1)

## 2024-02-21 LAB — HEPATITIS PANEL, ACUTE
HCV Ab: NONREACTIVE
Hep A IgM: NONREACTIVE
Hep B C IgM: NONREACTIVE
Hepatitis B Surface Ag: NONREACTIVE

## 2024-02-21 LAB — HIV ANTIBODY (ROUTINE TESTING W REFLEX): HIV Screen 4th Generation wRfx: NONREACTIVE

## 2024-02-21 LAB — MAGNESIUM: Magnesium: 2 mg/dL (ref 1.7–2.4)

## 2024-02-21 MED ORDER — LEVETIRACETAM 750 MG PO TABS: 1500.0000 mg | ORAL_TABLET | Freq: Every morning | ORAL | Status: DC

## 2024-02-21 MED ORDER — LEVETIRACETAM 750 MG PO TABS: 1500.0000 mg | ORAL_TABLET | Freq: Every evening | ORAL | Status: DC

## 2024-02-21 MED ORDER — PROMETHAZINE HCL 25 MG PO TABS
25.0000 mg | ORAL_TABLET | Freq: Four times a day (QID) | ORAL | Status: DC | PRN
  Administered 2024-02-21: 25 mg via ORAL
  Filled 2024-02-21 (×2): qty 1

## 2024-02-21 MED ORDER — LEVETIRACETAM 500 MG PO TABS
1000.0000 mg | ORAL_TABLET | Freq: Every morning | ORAL | Status: DC
  Administered 2024-02-21: 1000 mg via ORAL
  Filled 2024-02-21: qty 2

## 2024-02-21 MED ORDER — LEVETIRACETAM 500 MG PO TABS
1000.0000 mg | ORAL_TABLET | Freq: Every morning | ORAL | Status: DC
  Filled 2024-02-21: qty 2

## 2024-02-21 MED ORDER — LEVETIRACETAM 500 MG PO TABS: 1000.0000 mg | ORAL_TABLET | Freq: Every morning | ORAL | Status: DC

## 2024-02-21 MED ORDER — LACTATED RINGERS IV SOLN: INTRAVENOUS | Status: DC

## 2024-02-21 MED ORDER — ENOXAPARIN SODIUM 40 MG/0.4ML IJ SOSY: 40.0000 mg | PREFILLED_SYRINGE | INTRAMUSCULAR | Status: DC

## 2024-02-21 MED ORDER — LEVETIRACETAM 1000 MG PO TABS: ORAL_TABLET | ORAL | Status: DC

## 2024-02-21 NOTE — H&P (Signed)
 History and Physical    Peter Shaw QIO:[id number] DOB: 1996-02-03 DOA: 02/20/2024  Patient coming from: Home.  Chief Complaint: Seizures.  HPI: Peter Shaw is a 28 y.o. male with history of epilepsy was noticed to have 2 back-to-back seizures at his house by his roommates.  EMS was called and patient was brought to the ER.  On the way patient had another seizure as per the report.  Patient after waking up from seizure states that he has been compliant with his Lamictal and Keppra.  Denies any headache or any fever chills or any recent changes in the medications.  ED Course: In the ER patient was observed and was planned to be discharged when patient had another generalized tonic-clonic seizure.  Neurology was consulted patient was given Ativan 1 mg IV.  Vimpat was given 200 mg IV.  Neurologist increased patient's Keppra dose and admitted for further observation.  Labs show elevated LFTs and increased creatinine from baseline.  Was given fluid bolus.  Review of Systems: As per HPI, rest all negative.   Past Medical History:  Diagnosis Date   Epilepsy (HCC) 05/23/2017    Past Surgical History:  Procedure Laterality Date   CIRCUMCISION       reports that he has never smoked. He has never used smokeless tobacco. He reports that he does not currently use alcohol. He reports that he does not use drugs.  No Known Allergies  Family History  Problem Relation Age of Onset   Coronary artery disease Father        triple bypass    Prior to Admission medications   Medication Sig Start Date End Date Taking? Authorizing Provider  LamoTRIgine 200 MG TB24 24 hour tablet TAKE 2 TABLETS BY MOUTH TWICE A DAY 08/13/23  Yes Van Clines, MD  levETIRAcetam (KEPPRA) 1000 MG tablet Take 1 tablet (1,000 mg total) by mouth 2 (two) times daily. 06/13/23  Yes Van Clines, MD    Physical Exam: Constitutional: Moderately built and nourished. Vitals:   02/20/24 1716 02/20/24 1806 02/20/24 1830  02/20/24 2053  BP:  112/78 101/71 131/84  Pulse:  97 84 (!) 104  Resp:  (!) 25 17 17   Temp: 98.1 F (36.7 C) 98.2 F (36.8 C)  98.3 F (36.8 C)  TempSrc: Oral   Oral  SpO2:  98% 97% 96%  Weight:       Eyes: Anicteric no pallor. ENMT: No discharge from the ears eyes nose or mouth. Neck: No mass felt.  No neck rigidity. Respiratory: No rhonchi or crepitations. Cardiovascular: S1-S2 heard. Abdomen: Soft nontender bowel sound present. Musculoskeletal: Edema. Skin: No rash. Neurologic: Alert awake oriented to time place and person.  Moves all extremities. Psychiatric: Appear normal.  Normal affect.   Labs on Admission: I have personally reviewed following labs and imaging studies  CBC: Recent Labs  Lab 02/20/24 2051 02/20/24 2100  WBC 13.0*  --   NEUTROABS 9.8*  --   HGB 16.9 17.3*  HCT 52.3* 51.0  MCV 89.2  --   PLT 390  --    Basic Metabolic Panel: Recent Labs  Lab 02/20/24 2051 02/20/24 2100  NA 143 142  K 3.6 3.5  CL 106 112*  CO2 17*  --   GLUCOSE 127* 130*  BUN 18 20  CREATININE 1.34* 1.10  CALCIUM 9.7  --    GFR: CrCl cannot be calculated (Unknown ideal weight.). Liver Function Tests: Recent Labs  Lab 02/20/24 2051  AST 75*  ALT 53*  ALKPHOS 111  BILITOT 0.9  PROT 8.3*  ALBUMIN 5.1*   No results for input(s): "LIPASE", "AMYLASE" in the last 168 hours. No results for input(s): "AMMONIA" in the last 168 hours. Coagulation Profile: No results for input(s): "INR", "PROTIME" in the last 168 hours. Cardiac Enzymes: No results for input(s): "CKTOTAL", "CKMB", "CKMBINDEX", "TROPONINI" in the last 168 hours. BNP (last 3 results) No results for input(s): "PROBNP" in the last 8760 hours. HbA1C: No results for input(s): "HGBA1C" in the last 72 hours. CBG: Recent Labs  Lab 02/20/24 1718  GLUCAP 130*   Lipid Profile: No results for input(s): "CHOL", "HDL", "LDLCALC", "TRIG", "CHOLHDL", "LDLDIRECT" in the last 72 hours. Thyroid Function Tests: No  results for input(s): "TSH", "T4TOTAL", "FREET4", "T3FREE", "THYROIDAB" in the last 72 hours. Anemia Panel: No results for input(s): "VITAMINB12", "FOLATE", "FERRITIN", "TIBC", "IRON", "RETICCTPCT" in the last 72 hours. Urine analysis: No results found for: "COLORURINE", "APPEARANCEUR", "LABSPEC", "PHURINE", "GLUCOSEU", "HGBUR", "BILIRUBINUR", "KETONESUR", "PROTEINUR", "UROBILINOGEN", "NITRITE", "LEUKOCYTESUR" Sepsis Labs: @LABRCNTIP (procalcitonin:4,lacticidven:4) ) Recent Results (from the past 240 hours)  Resp panel by RT-PCR (RSV, Flu A&B, Covid) Anterior Nasal Swab     Status: None   Collection Time: 02/20/24  8:51 PM   Specimen: Anterior Nasal Swab  Result Value Ref Range Status   SARS Coronavirus 2 by RT PCR NEGATIVE NEGATIVE Final   Influenza A by PCR NEGATIVE NEGATIVE Final   Influenza B by PCR NEGATIVE NEGATIVE Final    Comment: (NOTE) The Xpert Xpress SARS-CoV-2/FLU/RSV plus assay is intended as an aid in the diagnosis of influenza from Nasopharyngeal swab specimens and should not be used as a sole basis for treatment. Nasal washings and aspirates are unacceptable for Xpert Xpress SARS-CoV-2/FLU/RSV testing.  Fact Sheet for Patients: BloggerCourse.com  Fact Sheet for Healthcare Providers: SeriousBroker.it  This test is not yet approved or cleared by the [place] FDA and has been authorized for detection and/or diagnosis of SARS-CoV-2 by FDA under an Emergency Use Authorization (EUA). This EUA will remain in effect (meaning this test can be used) for the duration of the COVID-19 declaration under Section 564(b)(1) of the Act, 21 U.S.C. section 360bbb-3(b)(1), unless the authorization is terminated or revoked.     Resp Syncytial Virus by PCR NEGATIVE NEGATIVE Final    Comment: (NOTE) Fact Sheet for Patients: BloggerCourse.com  Fact Sheet for Healthcare  Providers: SeriousBroker.it  This test is not yet approved or cleared by the [place] FDA and has been authorized for detection and/or diagnosis of SARS-CoV-2 by FDA under an Emergency Use Authorization (EUA). This EUA will remain in effect (meaning this test can be used) for the duration of the COVID-19 declaration under Section 564(b)(1) of the Act, 21 U.S.C. section 360bbb-3(b)(1), unless the authorization is terminated or revoked.  Performed at [facility], [address] [place], [place] [address]      Radiological Exams on Admission: CT Head Wo Contrast Result Date: 02/20/2024 CLINICAL DATA:  Sinusitis, acute, orbital or intracranial complication suspected EXAM: CT HEAD WITHOUT CONTRAST TECHNIQUE: Contiguous axial images were obtained from the base of the skull through the vertex without intravenous contrast. RADIATION DOSE REDUCTION: This exam was performed according to the departmental dose-optimization program which includes automated exposure control, adjustment of the mA and/or kV according to patient size and/or use of iterative reconstruction technique. COMPARISON:  CT head 07/29/2021 and MRI head 05/31/2023 FINDINGS: Brain: No intracranial hemorrhage, mass effect, or evidence of acute infarct. No hydrocephalus. No extra-axial fluid collection.  Vascular: No hyperdense vessel or unexpected calcification. Skull: No fracture or focal lesion. Sinuses/Orbits: Paranasal sinuses are well aerated. No mastoid effusion. Normal orbits. Other: None. IMPRESSION: Normal noncontrast CT of the head. Electronically Signed   By: Minerva Fester M.D.   On: 02/20/2024 21:58    EKG: Independently reviewed.  Sinus tachycardia.  Assessment/Plan Principal Problem:   Seizure (HCC) Active Problems:   Breakthrough seizure (HCC)    Breakthrough seizures  -     discussed with neurologist.  Plan is to increase his home dose of Keppra at bedtime from 1000 mg to 1500  mg and continue morning dose as 1000 mg.  Continue Lamictal 400 mg twice daily.  Observe overnight. Acute renal failure creatinine increased from baseline.  Gently hydrate follow metabolic panel. Elevated LFTs cause not clear abdomen appears benign.  Denies any nausea vomiting or abdominal pain.  Could be from a hypotensive episode during seizure.  Follow LFTs check acute hepatitis panel right upper quadrant ultrasound.  Since patient has had back-to-back seizures will need close monitoring and more than 2 midnight stay.   DVT prophylaxis: Lovenox. Code Status: Full code. Family Communication: Discussed with patient. Disposition Plan: Monitored bed. Consults called: Neurologist. Admission status: Observation.

## 2024-02-21 NOTE — ED Notes (Addendum)
 Patient removed his monitoring equipment and declined vitals before going upstairs.  Patient went upstairs in stable condition, AOX4, with his belongings and staff.

## 2024-02-21 NOTE — Discharge Summary (Signed)
 Physician Discharge Summary   Cavin Longman BMW:[id number] DOB: April 16, 1996 DOA: 02/20/2024  PCP: Irven Coe, MD  Admit date: 02/20/2024 Discharge date: 02/21/2024  Admitted From: Home Disposition:  Home Discharging physician: Lewie Chamber, MD Barriers to discharge: none  Recommendations at discharge: Follow up with neurology Follow up pending keppra and lamictal levels If doesn't tolerate increase in Keppra, consider alternative agent (vimpat discussed in hospital)   Discharge Condition: stable CODE STATUS: Full Diet recommendation:  Diet Orders (From admission, onward)     Start     Ordered   02/21/24 0210  Diet regular Room service appropriate? Yes; Fluid consistency: Thin  Diet effective now       Question Answer Comment  Room service appropriate? Yes   Fluid consistency: Thin      02/21/24 0210   02/21/24 0000  Diet general        02/21/24 1341            Hospital Course: Mr. Jasinski is a 28 yo male with PMH primary generalized epilepsy.  He follows outpatient with neurology.  He does have known history of breakthrough seizures and states he has gone approximately 1 year without a breakthrough seizure.  Per last neurology note, last breakthrough seizure was 04/20/2023.  He has been compliant on Lamictal and Keppra.  He has had some apprehension on increasing Keppra dose further due to concern for worsening diplopia. For this hospitalization, he was noticed to have had 2 seizures at home witnessed by his roommates followed by another seizure during EMS transport. He was treated with IV Keppra and Vimpat in the ER and stabilized, then had another seizure when being prepared for discharge. He was evaluated by neurology and recommended for Keppra dosage increase.  After monitoring overnight and repeat consultation with neurology in the morning, he was ultimately amenable with Keppra dosage increase.  He will transition to Keppra 1500 mg twice daily and continue on Lamictal 400  mg twice daily at discharge. Keppra and Lamictal levels were pending at discharge. His recent Keppra level from 08/18/2023 was reviewed and still noted to be subtherapeutic, 11.8.  With his ongoing and worsened home stressors along with possible ongoing subtherapeutic Keppra level, his seizure threshold may be lower than ideal allowing for seizure breakthroughs.   The patient's acute and chronic medical conditions were treated accordingly. On day of discharge, patient was felt deemed stable for discharge. Patient/family member advised to call PCP or come back to ER if needed.   Principal Diagnosis: Breakthrough seizure [facility]  Discharge Diagnoses: Active Hospital Problems   Diagnosis Date Noted   Breakthrough seizure (HCC) 02/21/2024    Priority: 1.   Nonintractable generalized idiopathic epilepsy without status epilepticus (HCC) 12/02/2018    Priority: 2.    Resolved Hospital Problems   Diagnosis Date Noted Date Resolved   AKI (acute kidney injury) (HCC) 02/21/2024 02/21/2024   Elevated LFTs 02/21/2024 02/21/2024     Discharge Instructions     Diet general   Complete by: As directed    Increase activity slowly   Complete by: As directed       Allergies as of 02/21/2024   No Known Allergies      Medication List     TAKE these medications    LamoTRIgine 200 MG Tb24 24 hour tablet TAKE 2 TABLETS BY MOUTH TWICE A DAY   levETIRAcetam 1000 MG tablet Commonly known as: KEPPRA Take 1.5 tablets (1500 mg) twice a day What changed:  how much to  take how to take this when to take this additional instructions        Follow-up Information     Van Clines, MD. Schedule an appointment as soon as possible for a visit in 2 week(s).   Specialty: Neurology Contact information: [address] [place] [address] [phone]                No Known Allergies  Consultations:   Procedures:   Discharge Exam: BP (!) 120/91 (BP Location: Right  Arm)   Pulse (!) 102   Temp 98.5 F (36.9 C) (Oral)   Resp 17   Ht 5\' 8"  (1.727 m)   Wt 75.3 kg   SpO2 96%   BMI 25.24 kg/m  Physical Exam Constitutional:      General: He is not in acute distress.    Appearance: Normal appearance.  HENT:     Head: Normocephalic and atraumatic.     Mouth/Throat:     Mouth: Mucous membranes are moist.  Eyes:     Extraocular Movements: Extraocular movements intact.  Cardiovascular:     Rate and Rhythm: Normal rate and regular rhythm.  Pulmonary:     Effort: Pulmonary effort is normal. No respiratory distress.     Breath sounds: Normal breath sounds. No wheezing.  Abdominal:     General: Bowel sounds are normal. There is no distension.     Palpations: Abdomen is soft.     Tenderness: There is no abdominal tenderness.  Musculoskeletal:        General: Normal range of motion.     Cervical back: Normal range of motion and neck supple.  Skin:    General: Skin is warm and dry.  Neurological:     General: No focal deficit present.     Mental Status: He is alert.  Psychiatric:        Mood and Affect: Mood normal.        Behavior: Behavior normal.      The results of significant diagnostics from this hospitalization (including imaging, microbiology, ancillary and laboratory) are listed below for reference.   Microbiology: Recent Results (from the past 240 hours)  Resp panel by RT-PCR (RSV, Flu A&B, Covid) Anterior Nasal Swab     Status: None   Collection Time: 02/20/24  8:51 PM   Specimen: Anterior Nasal Swab  Result Value Ref Range Status   SARS Coronavirus 2 by RT PCR NEGATIVE NEGATIVE Final   Influenza A by PCR NEGATIVE NEGATIVE Final   Influenza B by PCR NEGATIVE NEGATIVE Final    Comment: (NOTE) The Xpert Xpress SARS-CoV-2/FLU/RSV plus assay is intended as an aid in the diagnosis of influenza from Nasopharyngeal swab specimens and should not be used as a sole basis for treatment. Nasal washings and aspirates are unacceptable for  Xpert Xpress SARS-CoV-2/FLU/RSV testing.  Fact Sheet for Patients: BloggerCourse.com  Fact Sheet for Healthcare Providers: SeriousBroker.it  This test is not yet approved or cleared by the [place] FDA and has been authorized for detection and/or diagnosis of SARS-CoV-2 by FDA under an Emergency Use Authorization (EUA). This EUA will remain in effect (meaning this test can be used) for the duration of the COVID-19 declaration under Section 564(b)(1) of the Act, 21 U.S.C. section 360bbb-3(b)(1), unless the authorization is terminated or revoked.     Resp Syncytial Virus by PCR NEGATIVE NEGATIVE Final    Comment: (NOTE) Fact Sheet for Patients: BloggerCourse.com  Fact Sheet for Healthcare Providers: SeriousBroker.it  This test is not yet approved or cleared by the [place] and has been authorized for detection and/or diagnosis of SARS-CoV-2 by FDA under an Emergency Use Authorization (EUA). This EUA will remain in effect (meaning this test can be used) for the duration of the COVID-19 declaration under Section 564(b)(1) of the Act, 21 U.S.C. section 360bbb-3(b)(1), unless the authorization is terminated or revoked.  Performed at [facility], [address], [place], [place] [address]      Labs: BNP (last 3 results) No results for input(s): "BNP" in the last 8760 hours. Basic Metabolic Panel: Recent Labs  Lab 02/20/24 2051 02/20/24 2100 02/21/24 0358  NA 143 142 137  K 3.6 3.5 3.8  CL 106 112* 104  CO2 17*  --  23  GLUCOSE 127* 130* 103*  BUN 18 20 13   CREATININE 1.34* 1.10 0.94  CALCIUM 9.7  --  9.1  MG  --   --  2.0   Liver Function Tests: Recent Labs  Lab 02/20/24 2051 02/21/24 0358  AST 75* 51*  ALT 53* 42  ALKPHOS 111 89  BILITOT 0.9 0.9  PROT 8.3* 6.9  ALBUMIN 5.1* 4.2   No results for input(s): "LIPASE", "AMYLASE" in the last  168 hours. No results for input(s): "AMMONIA" in the last 168 hours. CBC: Recent Labs  Lab 02/20/24 2051 02/20/24 2100 02/21/24 0358  WBC 13.0*  --  7.1  NEUTROABS 9.8*  --  5.8  HGB 16.9 17.3* 14.7  HCT 52.3* 51.0 43.5  MCV 89.2  --  85.6  PLT 390  --  280   Cardiac Enzymes: No results for input(s): "CKTOTAL", "CKMB", "CKMBINDEX", "TROPONINI" in the last 168 hours. BNP: Invalid input(s): "POCBNP" CBG: Recent Labs  Lab 02/20/24 1718  GLUCAP 130*   D-Dimer No results for input(s): "DDIMER" in the last 72 hours. Hgb A1c No results for input(s): "HGBA1C" in the last 72 hours. Lipid Profile No results for input(s): "CHOL", "HDL", "LDLCALC", "TRIG", "CHOLHDL", "LDLDIRECT" in the last 72 hours. Thyroid function studies No results for input(s): "TSH", "T4TOTAL", "T3FREE", "THYROIDAB" in the last 72 hours.  Invalid input(s): "FREET3" Anemia work up No results for input(s): "VITAMINB12", "FOLATE", "FERRITIN", "TIBC", "IRON", "RETICCTPCT" in the last 72 hours. Urinalysis No results found for: "COLORURINE", "APPEARANCEUR", "LABSPEC", "PHURINE", "GLUCOSEU", "HGBUR", "BILIRUBINUR", "KETONESUR", "PROTEINUR", "UROBILINOGEN", "NITRITE", "LEUKOCYTESUR" Sepsis Labs Recent Labs  Lab 02/20/24 2051 02/21/24 0358  WBC 13.0* 7.1   Microbiology Recent Results (from the past 240 hours)  Resp panel by RT-PCR (RSV, Flu A&B, Covid) Anterior Nasal Swab     Status: None   Collection Time: 02/20/24  8:51 PM   Specimen: Anterior Nasal Swab  Result Value Ref Range Status   SARS Coronavirus 2 by RT PCR NEGATIVE NEGATIVE Final   Influenza A by PCR NEGATIVE NEGATIVE Final   Influenza B by PCR NEGATIVE NEGATIVE Final    Comment: (NOTE) The Xpert Xpress SARS-CoV-2/FLU/RSV plus assay is intended as an aid in the diagnosis of influenza from Nasopharyngeal swab specimens and should not be used as a sole basis for treatment. Nasal washings and aspirates are unacceptable for Xpert Xpress  SARS-CoV-2/FLU/RSV testing.  Fact Sheet for Patients: BloggerCourse.com  Fact Sheet for Healthcare Providers: SeriousBroker.it  This test is not yet approved or cleared by the [place] FDA and has been authorized for detection and/or diagnosis of SARS-CoV-2 by FDA under an Emergency Use Authorization (EUA). This EUA will remain in effect (meaning this test can be  used) for the duration of the COVID-19 declaration under Section 564(b)(1) of the Act, 21 U.S.C. section 360bbb-3(b)(1), unless the authorization is terminated or revoked.     Resp Syncytial Virus by PCR NEGATIVE NEGATIVE Final    Comment: (NOTE) Fact Sheet for Patients: BloggerCourse.com  Fact Sheet for Healthcare Providers: SeriousBroker.it  This test is not yet approved or cleared by the [place] FDA and has been authorized for detection and/or diagnosis of SARS-CoV-2 by FDA under an Emergency Use Authorization (EUA). This EUA will remain in effect (meaning this test can be used) for the duration of the COVID-19 declaration under Section 564(b)(1) of the Act, 21 U.S.C. section 360bbb-3(b)(1), unless the authorization is terminated or revoked.  Performed at [facility], [address] [place], [place] [address]     Procedures/Studies: CT Head Wo Contrast Result Date: 02/20/2024 CLINICAL DATA:  Sinusitis, acute, orbital or intracranial complication suspected EXAM: CT HEAD WITHOUT CONTRAST TECHNIQUE: Contiguous axial images were obtained from the base of the skull through the vertex without intravenous contrast. RADIATION DOSE REDUCTION: This exam was performed according to the departmental dose-optimization program which includes automated exposure control, adjustment of the mA and/or kV according to patient size and/or use of iterative reconstruction technique. COMPARISON:  CT head 07/29/2021 and MRI  head 05/31/2023 FINDINGS: Brain: No intracranial hemorrhage, mass effect, or evidence of acute infarct. No hydrocephalus. No extra-axial fluid collection. Vascular: No hyperdense vessel or unexpected calcification. Skull: No fracture or focal lesion. Sinuses/Orbits: Paranasal sinuses are well aerated. No mastoid effusion. Normal orbits. Other: None. IMPRESSION: Normal noncontrast CT of the head. Electronically Signed   By: Minerva Fester M.D.   On: 02/20/2024 21:58     Time coordinating discharge: Over 30 minutes    Lewie Chamber, MD  [facility] 02/21/2024, 1:42 PM

## 2024-02-21 NOTE — ED Notes (Signed)
 ED TO INPATIENT HANDOFF REPORT  ED Nurse Name and Phone #: Lolita Rieger Name/Age/Gender Peter Shaw 28 y.o. male Room/Bed: 012C/012C  Code Status   Code Status: Full Code  Home/SNF/Other Home Patient oriented to: self, place, time, and situation Is this baseline? Yes   Triage Complete: Triage complete  Chief Complaint Seizure [facility]) [R56.9] Breakthrough seizure (HCC) [G40.919]  Triage Note Pt bib ems from home; pt had 2 witnessed seizures; on ems arrival pt a and o initially; pt had another seizure with ems; post ictal with ems; initial 12 lead unremarkable; vss; compliant with keppra; states he has multiple seizures every 3-4 months; no iv, no meds given pta; pt answering questions appropriately at present   Allergies No Known Allergies  Level of Care/Admitting Diagnosis ED Disposition     ED Disposition  Admit   Condition  --   Comment  Hospital Area: [facility] [[id number]]  Level of Care: Telemetry Medical [104]  May place patient in observation at [facility] or [facility] if equivalent level of care is available:: No  Covid Evaluation: Asymptomatic - no recent exposure (last 10 days) testing not required  Diagnosis: Breakthrough seizure [facility]) [[id number]]  Admitting Physician: Eduard Clos [phone]  Attending Physician: John Giovanni          B Medical/Surgery History Past Medical History:  Diagnosis Date   Epilepsy (HCC) 05/23/2017   Past Surgical History:  Procedure Laterality Date   CIRCUMCISION       A IV Location/Drains/Wounds Patient Lines/Drains/Airways Status     Active Line/Drains/Airways     Name Placement date Placement time Site Days   Peripheral IV 02/20/24 18 G Anterior;Left Forearm 02/20/24  2043  Forearm  1            Intake/Output Last 24 hours No intake or output data in the 24 hours ending 02/21/24 2536  Labs/Imaging Results for orders placed or performed during the hospital  encounter of 02/20/24 (from the past 48 hours)  POC CBG, ED     Status: Abnormal   Collection Time: 02/20/24  5:18 PM  Result Value Ref Range   Glucose-Capillary 130 (H) 70 - 99 mg/dL    Comment: Glucose reference range applies only to samples taken after fasting for at least 8 hours.  CBC with Differential     Status: Abnormal   Collection Time: 02/20/24  8:51 PM  Result Value Ref Range   WBC 13.0 (H) 4.0 - 10.5 K/uL   RBC 5.86 (H) 4.22 - 5.81 MIL/uL   Hemoglobin 16.9 13.0 - 17.0 g/dL   HCT 64.4 (H) 03.4 - 74.2 %   MCV 89.2 80.0 - 100.0 fL   MCH 28.8 26.0 - 34.0 pg   MCHC 32.3 30.0 - 36.0 g/dL   RDW 59.5 63.8 - 75.6 %   Platelets 390 150 - 400 K/uL   nRBC 0.0 0.0 - 0.2 %   Neutrophils Relative % 75 %   Neutro Abs 9.8 (H) 1.7 - 7.7 K/uL   Lymphocytes Relative 15 %   Lymphs Abs 1.9 0.7 - 4.0 K/uL   Monocytes Relative 9 %   Monocytes Absolute 1.1 (H) 0.1 - 1.0 K/uL   Eosinophils Relative 0 %   Eosinophils Absolute 0.0 0.0 - 0.5 K/uL   Basophils Relative 0 %   Basophils Absolute 0.0 0.0 - 0.1 K/uL   Immature Granulocytes 1 %   Abs Immature Granulocytes 0.06 0.00 - 0.07 K/uL  Comment: Performed at [facility], [address] [place], [place] [address]  Comprehensive metabolic panel     Status: Abnormal   Collection Time: 02/20/24  8:51 PM  Result Value Ref Range   Sodium 143 135 - 145 mmol/L   Potassium 3.6 3.5 - 5.1 mmol/L   Chloride 106 98 - 111 mmol/L   CO2 17 (L) 22 - 32 mmol/L   Glucose, Bld 127 (H) 70 - 99 mg/dL    Comment: Glucose reference range applies only to samples taken after fasting for at least 8 hours.   BUN 18 6 - 20 mg/dL   Creatinine, Ser 1.91 (H) 0.61 - 1.24 mg/dL   Calcium 9.7 8.9 - 47.8 mg/dL   Total Protein 8.3 (H) 6.5 - 8.1 g/dL   Albumin 5.1 (H) 3.5 - 5.0 g/dL   AST 75 (H) 15 - 41 U/L   ALT 53 (H) 0 - 44 U/L    Comment: RESULT CONFIRMED BY MANUAL DILUTION   Alkaline Phosphatase 111 38 - 126 U/L   Total Bilirubin 0.9 0.0 - 1.2 mg/dL    GFR, Estimated >29 >56 mL/min    Comment: (NOTE) Calculated using the CKD-EPI Creatinine Equation (2021)    Anion gap 20 (H) 5 - 15    Comment: Performed at [facility], [address] [place], [place] [address]  Ethanol     Status: None   Collection Time: 02/20/24  8:51 PM  Result Value Ref Range   Alcohol, Ethyl (B) <10 <10 mg/dL    Comment: (NOTE) Lowest detectable limit for serum alcohol is 10 mg/dL.  For medical purposes only. Performed at [facility], [address] [place], [place] [address]   Resp panel by RT-PCR (RSV, Flu A&B, Covid) Anterior Nasal Swab     Status: None   Collection Time: 02/20/24  8:51 PM   Specimen: Anterior Nasal Swab  Result Value Ref Range   SARS Coronavirus 2 by RT PCR NEGATIVE NEGATIVE   Influenza A by PCR NEGATIVE NEGATIVE   Influenza B by PCR NEGATIVE NEGATIVE    Comment: (NOTE) The Xpert Xpress SARS-CoV-2/FLU/RSV plus assay is intended as an aid in the diagnosis of influenza from Nasopharyngeal swab specimens and should not be used as a sole basis for treatment. Nasal washings and aspirates are unacceptable for Xpert Xpress SARS-CoV-2/FLU/RSV testing.  Fact Sheet for Patients: BloggerCourse.com  Fact Sheet for Healthcare Providers: SeriousBroker.it  This test is not yet approved or cleared by the [place] FDA and has been authorized for detection and/or diagnosis of SARS-CoV-2 by FDA under an Emergency Use Authorization (EUA). This EUA will remain in effect (meaning this test can be used) for the duration of the COVID-19 declaration under Section 564(b)(1) of the Act, 21 U.S.C. section 360bbb-3(b)(1), unless the authorization is terminated or revoked.     Resp Syncytial Virus by PCR NEGATIVE NEGATIVE    Comment: (NOTE) Fact Sheet for Patients: BloggerCourse.com  Fact Sheet for Healthcare  Providers: SeriousBroker.it  This test is not yet approved or cleared by the [place] FDA and has been authorized for detection and/or diagnosis of SARS-CoV-2 by FDA under an Emergency Use Authorization (EUA). This EUA will remain in effect (meaning this test can be used) for the duration of the COVID-19 declaration under Section 564(b)(1) of the Act, 21 U.S.C. section 360bbb-3(b)(1), unless the authorization is terminated or revoked.  Performed at [facility], [address], [place], [place] [address]  I-stat chem 8, ED (not at [facility], [facility] or [facility])     Status: Abnormal   Collection Time: 02/20/24  9:00 PM  Result Value Ref Range   Sodium 142 135 - 145 mmol/L   Potassium 3.5 3.5 - 5.1 mmol/L   Chloride 112 (H) 98 - 111 mmol/L   BUN 20 6 - 20 mg/dL   Creatinine, Ser 5.40 0.61 - 1.24 mg/dL   Glucose, Bld 981 (H) 70 - 99 mg/dL    Comment: Glucose reference range applies only to samples taken after fasting for at least 8 hours.   Calcium, Ion 1.07 (L) 1.15 - 1.40 mmol/L   TCO2 14 (L) 22 - 32 mmol/L   Hemoglobin 17.3 (H) 13.0 - 17.0 g/dL   HCT 19.1 47.8 - 29.5 %  Comprehensive metabolic panel     Status: Abnormal   Collection Time: 02/21/24  3:58 AM  Result Value Ref Range   Sodium 137 135 - 145 mmol/L   Potassium 3.8 3.5 - 5.1 mmol/L   Chloride 104 98 - 111 mmol/L   CO2 23 22 - 32 mmol/L   Glucose, Bld 103 (H) 70 - 99 mg/dL    Comment: Glucose reference range applies only to samples taken after fasting for at least 8 hours.   BUN 13 6 - 20 mg/dL   Creatinine, Ser 6.21 0.61 - 1.24 mg/dL   Calcium 9.1 8.9 - 30.8 mg/dL   Total Protein 6.9 6.5 - 8.1 g/dL   Albumin 4.2 3.5 - 5.0 g/dL   AST 51 (H) 15 - 41 U/L   ALT 42 0 - 44 U/L   Alkaline Phosphatase 89 38 - 126 U/L   Total Bilirubin 0.9 0.0 - 1.2 mg/dL   GFR, Estimated >65 >78 mL/min    Comment: (NOTE) Calculated using the CKD-EPI Creatinine Equation (2021)    Anion gap 10 5 - 15     Comment: Performed at [facility], [address] [place], [place] [address]  Magnesium     Status: None   Collection Time: 02/21/24  3:58 AM  Result Value Ref Range   Magnesium 2.0 1.7 - 2.4 mg/dL    Comment: Performed at [facility], [address] [place], [place] [address]  CBC with Differential/Platelet     Status: Abnormal   Collection Time: 02/21/24  3:58 AM  Result Value Ref Range   WBC 7.1 4.0 - 10.5 K/uL   RBC 5.08 4.22 - 5.81 MIL/uL   Hemoglobin 14.7 13.0 - 17.0 g/dL   HCT 13.2 44.0 - 10.2 %   MCV 85.6 80.0 - 100.0 fL   MCH 28.9 26.0 - 34.0 pg   MCHC 33.8 30.0 - 36.0 g/dL   RDW 72.5 36.6 - 44.0 %   Platelets 280 150 - 400 K/uL   nRBC 0.0 0.0 - 0.2 %   Neutrophils Relative % 82 %   Neutro Abs 5.8 1.7 - 7.7 K/uL   Lymphocytes Relative 9 %   Lymphs Abs 0.6 (L) 0.7 - 4.0 K/uL   Monocytes Relative 9 %   Monocytes Absolute 0.7 0.1 - 1.0 K/uL   Eosinophils Relative 0 %   Eosinophils Absolute 0.0 0.0 - 0.5 K/uL   Basophils Relative 0 %   Basophils Absolute 0.0 0.0 - 0.1 K/uL   Immature Granulocytes 0 %   Abs Immature Granulocytes 0.01 0.00 - 0.07 K/uL    Comment: Performed at [facility], [address] [place],  [place] [address]   CT Head Wo Contrast Result Date: 02/20/2024 CLINICAL DATA:  Sinusitis, acute, orbital or intracranial complication suspected EXAM: CT HEAD WITHOUT CONTRAST TECHNIQUE: Contiguous axial images were obtained from the base of the skull through the vertex without intravenous contrast. RADIATION DOSE REDUCTION: This exam was performed according to the departmental dose-optimization program which includes automated exposure control, adjustment of the mA and/or kV according to patient size and/or use of iterative reconstruction technique. COMPARISON:  CT head 07/29/2021 and MRI head 05/31/2023 FINDINGS: Brain: No intracranial hemorrhage, mass effect, or evidence of acute infarct. No hydrocephalus. No extra-axial fluid collection.  Vascular: No hyperdense vessel or unexpected calcification. Skull: No fracture or focal lesion. Sinuses/Orbits: Paranasal sinuses are well aerated. No mastoid effusion. Normal orbits. Other: None. IMPRESSION: Normal noncontrast CT of the head. Electronically Signed   By: Minerva Fester M.D.   On: 02/20/2024 21:58    Pending Labs Unresulted Labs (From admission, onward)     Start     Ordered   02/28/24 0500  Creatinine, serum  (enoxaparin (LOVENOX)    CrCl >/= 30 ml/min)  Weekly,   R     Comments: while on enoxaparin therapy    02/21/24 0210   02/21/24 0422  Hepatitis panel, acute  Once,   R        02/21/24 0421   02/21/24 0210  HIV Antibody (routine testing w rflx)  (HIV Antibody (Routine testing w reflex) panel)  Once,   R        02/21/24 0210   02/21/24 0210  CBC  (enoxaparin (LOVENOX)    CrCl >/= 30 ml/min)  Once,   R       Comments: Baseline for enoxaparin therapy IF NOT ALREADY DRAWN.  Notify MD if PLT < 100 K.    02/21/24 0210   02/20/24 2038  Urinalysis, w/ Reflex to Culture (Infection Suspected) -Urine, Clean Catch  Once,   URGENT       Question:  Specimen Source  Answer:  Urine, Clean Catch   02/20/24 2037   02/20/24 2037  Rapid urine drug screen (hospital performed)  ONCE - STAT,   STAT        02/20/24 2037   02/20/24 1715  Levetiracetam level  Once,   URGENT        02/20/24 1714   02/20/24 1715  Lamotrigine level  Once,   URGENT        02/20/24 1714            Vitals/Pain Today's Vitals   02/21/24 0210 02/21/24 0213 02/21/24 0534 02/21/24 0600  BP: 113/72  (!) 107/49 97/68  Pulse: (!) 104  (!) 118 91  Resp: (!) 25  (!) 21 15  Temp:  98.5 F (36.9 C) 99.1 F (37.3 C)   TempSrc:  Oral Oral   SpO2: 94%  99% 92%  Weight:   75.3 kg   Height:   5\' 8"  (1.727 m)   PainSc:  2  0-No pain     Isolation Precautions No active isolations  Medications Medications  lamoTRIgine (LAMICTAL) tablet 400 mg (400 mg Oral Given 02/20/24 2331)  enoxaparin (LOVENOX)  injection 40 mg (has no administration in time range)  levETIRAcetam (KEPPRA) tablet 1,000 mg (has no administration in time range)    And  levETIRAcetam (KEPPRA) tablet 1,500 mg (has no administration in time range)  lactated ringers infusion ( Intravenous New Bag/Given 02/21/24 0534)  levETIRAcetam (KEPPRA) IVPB 1500 mg/ 100 mL premix (  0 mg Intravenous Stopped 02/20/24 1742)  ondansetron (ZOFRAN) injection 4 mg (4 mg Intravenous Given 02/20/24 2047)  lactated ringers bolus 1,000 mL (0 mLs Intravenous Stopped 02/20/24 2123)  lacosamide (VIMPAT) 200 mg in sodium chloride 0.9 % 25 mL IVPB (0 mg Intravenous Stopped 02/20/24 2203)  LORazepam (ATIVAN) injection 1 mg (1 mg Intravenous Given 02/20/24 2134)    Mobility walks     Focused Assessments Neuro Assessment Handoff:  Swallow screen pass? Yes          Neuro Assessment:   Neuro Checks:      Has TPA been given? No If patient is a Neuro Trauma and patient is going to OR before floor call report to 4N Charge nurse: [phone] or [phone]   R Recommendations: See Admitting Provider Note  Report given to:   Additional Notes: Pt is alert and oriented, walky talky, from home, was up for discharge and about to leave, had another seizure, and decided to admit him.

## 2024-02-21 NOTE — ED Notes (Signed)
 Patient was found in the floor on his right side. Patient was positical. MD made aware immediately. Patient assisted out of floor. Patient noted with a hematoma to left forehead with small abrasion, no bleeding noted. Vital signs obtained, MD placed new orders.  Prior to patient falling to floor patient was alert and oriented x4, getting dress and waiting for his friends to be discharge.

## 2024-02-21 NOTE — Progress Notes (Addendum)
 NEUROLOGY CONSULT FOLLOW UP NOTE   Date of service: February 21, 2024 Patient Name: Peter Shaw MRN:  [id number] DOB:  10/20/1996  Interval Hx/subjective   Significant additional history obtained from patient and sister at bedside  He is experiencing seizures and is currently on Keppra and lamotrigine for management. He takes Keppra 1000 mg twice a day at noon and midnight, and lamotrigine 400 mg twice a day at the same times. He has never taken Keppra three times a day.  He reports side effects from Keppra, specifically double vision, which occurs randomly and lasts about an hour, happening once every two weeks. The double vision started as soon as he began taking Keppra, which was added after he was already on lamotrigine. He did not start at a lower dose of Keppra; it was prescribed at 1000 mg. The double vision is not clearly time-locked to the medication intake and does not occur daily.  He experienced a seizure recently, which was preceded by severe nausea during his shift at [employer]. He took delta-8 to manage the nausea, which helped temporarily, but the nausea returned the next morning. He did not vomit until after the seizure. He was concerned about missing medication doses due to vomiting but confirmed he took his medication at noon and arrived at the hospital around 4 PM.   He has a history of nausea and headaches preceding seizures, which his sister notes as a consistent pattern. He works night shifts, which affects his sleep schedule. He has not been sleep-deprived recently but acknowledges stress and nausea as potential seizure triggers.  He finds it challenging not to drive for six months following a seizure. He has been using alternative transportation options like Lyft and Uber. His sister is concerned about his safety and the possibility of him living alone, given his seizure history.  She reports he has stated that he will continue to drive despite his seizure  Vitals    Vitals:   02/21/24 0800 02/21/24 0815 02/21/24 1003 02/21/24 1013  BP: (!) 118/56 (!) 106/54  (!) 120/91  Pulse: (!) 105 88  (!) 102  Resp:    17  Temp:   98.7 F (37.1 C) 98.5 F (36.9 C)  TempSrc:   Oral Oral  SpO2: 95% 95%  96%  Weight:      Height:         Body mass index is 25.24 kg/m.  Physical Exam   Constitutional: Appears well-developed and well-nourished.  Psych: Affect somewhat indifferent, cooperative, pleasant Eyes: No scleral injection.  HENT: No OP obstrucion.  Head: Normocephalic.  Cardiovascular: Normal rate and regular rhythm.  Respiratory: Effort normal, non-labored breathing  GI: Soft.  No distension. There is no tenderness.   Neurologic Examination   Fluent speech, fully oriented to person, place, time, situation, Using all extremities equally spontaneously with good antigravity strength in the bilateral upper extremities  Medications  Current Facility-Administered Medications:    enoxaparin (LOVENOX) injection 40 mg, 40 mg, Subcutaneous, Q24H, Peter Clos, MD   lactated ringers infusion, , Intravenous, Continuous, Peter Clos, MD, Stopped at 02/21/24 0754   lamoTRIgine (LAMICTAL) tablet 400 mg, 400 mg, Oral, BID, Peter Clos, MD, 400 mg at 02/21/24 1208   promethazine (PHENERGAN) tablet 25 mg, 25 mg, Oral, Q6H PRN, Peter Chamber, MD, 25 mg at 02/21/24 1140  Current Outpatient Medications:    LamoTRIgine 200 MG TB24 24 hour tablet, TAKE 2 TABLETS BY MOUTH TWICE A DAY, Disp: 360 tablet, Rfl:  3   levETIRAcetam (KEPPRA) 1000 MG tablet, Take 1.5 tablets (1500 mg) twice a day, Disp: , Rfl:   Labs and Diagnostic Imaging   CBC:  Recent Labs  Lab 02/20/24 2051 02/20/24 2100 02/21/24 0358  WBC 13.0*  --  7.1  NEUTROABS 9.8*  --  5.8  HGB 16.9 17.3* 14.7  HCT 52.3* 51.0 43.5  MCV 89.2  --  85.6  PLT 390  --  280    Basic Metabolic Panel:  Lab Results  Component Value Date   NA 137 02/21/2024   K 3.8 02/21/2024    CO2 23 02/21/2024   GLUCOSE 103 (H) 02/21/2024   BUN 13 02/21/2024   CREATININE 0.94 02/21/2024   CALCIUM 9.1 02/21/2024   GFRNONAA >60 02/21/2024   Alcohol Level     Component Value Date/Time   [facility] <10 02/20/2024 2051   AED levels:  Lab Results  Component Value Date   LAMOTRIGINE 12.5 02/18/2023    Assessment   Peter Shaw is a 28 y.o. male presenting with breakthrough seizures.  He feels like nausea is a separate problem but of note his sister feels that nausea may be a seizure aura for him.  She also expresses significant concerns about him not being committed to reporting seizures to the [facility] and  Recommendations   Epilepsy Epilepsy managed with Keppra and lamotrigine. Recent seizure without clear provoking factors. Reports intermittent diplopia, possibly related to Keppra, but  no clear correlation with doses of Keppra. Discussed potential interactions with delta-8 THC. Emphasized medication adherence and avoiding triggers such as stress and sleep deprivation. Informed consent obtained for increasing Keppra dose, discussing risks of increased side effects (e.g., somnolence, irritability) and potential benefits of better seizure control. Discussed alternative of adding Vimpat and its cost considerations. - Increase Keppra to 1500 mg BID - Continue lamotrigine 400 twice daily - Monitor and document diplopia episodes and correlation with medication timing - Discuss potential addition of Vimpat with primary neurologist if needed - Consider wearable devices such as epimonitor by empatica to better quantify seizure frequency for appropriate medication titration outpatient - Schedule follow-up with Peter Shaw  Nausea Persistent nausea, potentially triggering seizures. Managed with delta-8 THC, which may interact with seizure medications. Discussed need for alternative nausea management strategies. - Discuss alternative nausea management strategies with primary care physician -  Monitor nausea and its potential role correlation as a seizure aura or marker of seizures he does not recall  Seizure precautions reviewed.  Request for medical evaluation form faxed to [facility] due to family stated concern that patient would not self-report  ______________________________________________________________________  Brooke Dare MD-PhD [facility] [phone] Available 7 AM to 7 PM, outside these hours please contact Neurologist on call listed on AMION   >50 min spent in care, majority at bedside

## 2024-02-21 NOTE — Hospital Course (Signed)
 Mr. Peter Shaw is a 28 yo male with PMH primary generalized epilepsy.  He follows outpatient with neurology.  He does have known history of breakthrough seizures and states he has gone approximately 1 year without a breakthrough seizure.  Per last neurology note, last breakthrough seizure was 04/20/2023.  He has been compliant on Lamictal and Keppra.  He has had some apprehension on increasing Keppra dose further due to concern for worsening diplopia. For this hospitalization, he was noticed to have had 2 seizures at home witnessed by his roommates followed by another seizure during EMS transport. He was treated with IV Keppra and Vimpat in the ER and stabilized, then had another seizure when being prepared for discharge. He was evaluated by neurology and recommended for Keppra dosage increase.  After monitoring overnight and repeat consultation with neurology in the morning, he was ultimately amenable with Keppra dosage increase.  He will transition to Keppra 1500 mg twice daily and continue on Lamictal 400 mg twice daily at discharge. Keppra and Lamictal levels were pending at discharge. His recent Keppra level from 08/18/2023 was reviewed and still noted to be subtherapeutic, 11.8.  With his ongoing and worsened home stressors along with possible ongoing subtherapeutic Keppra level, his seizure threshold may be lower than ideal allowing for seizure breakthroughs.

## 2024-02-21 NOTE — Plan of Care (Signed)
 Discussed with Dr. Frederick Peers  Patient hesitant to increase keppra due to diplopia on higher doses  Lamotrigine level still in process Add vimpat 50 mg BID for now, close f/u with Dr. Clovis Pu MD-PhD [facility] [phone] Available 7 AM to 7 PM, outside these hours please contact Neurologist on call listed on AMION

## 2024-02-21 NOTE — ED Notes (Signed)
 Patient refused [place] stating he did not think he needed it.MD made aware.

## 2024-02-21 NOTE — ED Notes (Signed)
 Patient unhook himself from monitor and walked to bathroom, patient was advised and given a urine cup however patient stated he forgot he had the cup in his hand. No urine obtained

## 2024-02-23 ENCOUNTER — Telehealth: Payer: Self-pay | Admitting: Neurology

## 2024-02-23 LAB — LEVETIRACETAM LEVEL: Levetiracetam Lvl: 11.6 ug/mL (ref 10.0–40.0)

## 2024-02-23 LAB — LAMOTRIGINE LEVEL: Lamotrigine Lvl: 5.9 ug/mL (ref 2.0–20.0)

## 2024-02-23 NOTE — Telephone Encounter (Signed)
 Pt. Calling with breakthrough seizure, pls call back

## 2024-02-23 NOTE — Telephone Encounter (Signed)
 Pt c/o: seizure Missed medications?  No. Sleep deprived?  No. Alcohol intake?  No. Increased stress? No. Any change in medication color or shape? No. Any trigger? No Back to their usual baseline self?  Yes.  . If no, advise go to ER Current medications prescribed by Dr. Karel Jarvis:   levETIRAcetam (KEPPRA) 1000 MG tablet Take 1.5 tablets (1500 mg) twice a day  LamoTRIgine 200 MG TB24 24 hour tablet TAKE 2 TABLETS BY MOUTH TWICE A DAY   When to [place] upped keppra to 1500 mg  Day before he was really nauseas and then the day of got sick thinks he may be increase of stress of being sick, did have watery stool.  He can't remember much of what happened, had a long postictal phase.

## 2024-02-24 NOTE — Telephone Encounter (Signed)
 Pt called back in wanting to speak with Peter Shaw about his breakthrough seizure.

## 2024-02-25 NOTE — Telephone Encounter (Signed)
 Scheduled for f/u 3/6 to discuss

## 2024-02-26 ENCOUNTER — Telehealth: Payer: Self-pay | Admitting: Neurology

## 2024-02-26 ENCOUNTER — Encounter: Payer: Self-pay | Admitting: Neurology

## 2024-02-26 ENCOUNTER — Ambulatory Visit: Admitting: Neurology

## 2024-02-26 DIAGNOSIS — Z029 Encounter for administrative examinations, unspecified: Secondary | ICD-10-CM

## 2024-02-26 NOTE — Telephone Encounter (Signed)
 Patient was sick today and missed his appt but he would like to speak to Herbert Seta  he has some questions for her

## 2024-02-27 NOTE — Telephone Encounter (Signed)
 Pt called said he missed his appointment because he was sick vomiting, he is rescheduled for 03/07/24.  Pt stated that the Keppra 1500mg  was to strong for him that it was causing double vision he dropped it back down to 1000mg  BID he is asking if that is ok or if he needs to go back up to the 1500 until he sees you, he asked if "Clydie Braun would call him" Pt was advised that in a the professional setting she is to be called Dr Karel Jarvis. He was informed that she would address his questions about his breakthrough seizure at his appointment on 03/07/24 she had a full schedule today. Pt thinks he seizure was due to the stomach bug,

## 2024-03-01 NOTE — Telephone Encounter (Signed)
 F/u 3/12 to discuss

## 2024-03-03 ENCOUNTER — Other Ambulatory Visit

## 2024-03-03 ENCOUNTER — Ambulatory Visit (INDEPENDENT_AMBULATORY_CARE_PROVIDER_SITE_OTHER): Admitting: Neurology

## 2024-03-03 ENCOUNTER — Encounter: Payer: Self-pay | Admitting: Neurology

## 2024-03-03 VITALS — BP 136/79 | HR 88 | Ht 68.0 in | Wt 145.0 lb

## 2024-03-03 DIAGNOSIS — G40309 Generalized idiopathic epilepsy and epileptic syndromes, not intractable, without status epilepticus: Secondary | ICD-10-CM

## 2024-03-03 MED ORDER — LEVETIRACETAM 1000 MG PO TABS: 1000.0000 mg | ORAL_TABLET | Freq: Two times a day (BID) | ORAL | 3 refills | Status: DC

## 2024-03-03 NOTE — Progress Notes (Signed)
 NEUROLOGY FOLLOW UP OFFICE NOTE  Peter Shaw [id number] [birth year]  HISTORY OF PRESENT ILLNESS: I had the pleasure of seeing Peter Shaw in follow-up in the neurology clinic on 03/03/2024.  The patient was last seen 7 months ago for Primary Generalized Epilepsy. He is alone in the office today. Records and images were personally reviewed where available.  Since his last visit, he had another seizure on 02/20/24. He had significant diarrhea the day prior. He denies sleep deprivation. His roommates found him in the bathroom where he had thrown up. He does not know how many seizures he had at home, per notes he had 2 GTCs at home, then another with EMS. He was given IV Levetiracetam and Lacosamide in the ER, then had another seizure as he was being prepared for ER discharge and was admitted overnight. ASM levels were drawn, LEV 11.6 (11.8 in 07/2023), LTG 5.9 (12.5 in 01/2023). Bloodwork showed WBC 13, AST/ALT 75/53 initially, then 51/42. EtOH <10. Head CT no acute changes. Levetiracetam dose was increased to 1500mg  BID, however he expressed concern about diplopia prior to dose increase that worsened, so he reduced back to 1000mg  BID 3 days ago with not much improvement in vision changes.  He continued to have GI symptoms with diarrhea and nausea up until yesterday. He is concerned Keppra is causing vision changes, he started noticing occasional incidents in December, but it was more prevalent in February where he would see 4 squirrels on the ground and see them running up a tree. It looks like things are moving upward, then he has a massive headache because his eyes cannot cope with it. He has noticed these vision changes within an hour of taking medications. When he takes medication on a full stomach, he does not have the vision changes. He is very concerned about the elevated liver enzymes and his seizure medication intake history over the years. He drinks one beer a week, if any. He now works at [employer].   History on Initial Assessment 01/26/2020: This is a pleasant 28 year old right-handed man presenting for second opinion regarding seizures. Records from Dr. Marjory Lies were reviewed. The first seizure occurred at age 65 right after graduation. he was preparing for a trip and woke up early at 4:30am, there were no warning symptoms, he woke up in the hospital with no memory of events. His mother had heard him fall and witnessed a GTC lasting 5 minutes. He was restless when EMS arrived, confused and disoriented, and brought to a hospital in [place]. He had an MRI and EEG. Per Dr. Richrd Humbles note, EEG in June 2018 was apparently suggestive of generalized epilepsy with 2.5-3 Hz generalized spikes during hyperventilation, bifrontally predominant spike and wave in sleep, and a single left frontal spike. Abnormal discharges noted to be in runs up to 7-10 seconds. MRI brain reportedly unremarkable. He was given a prescription for Keppra which he did not take. He traveled to [place] with no issues. The next seizure occurred around 2 years later in December 2020, again around 4am. He recalls falling asleep on the couch, he woke up at 3am, took a shower, then went back to bed at 4am. Around 10 minutes of falling asleep, his roommate heard a loud groan and found him off the bed. There was no report of shaking, he had rolled over, stiff, and was not responding for 5 minutes. No tongue bite or incontinence, he recalls seeing EMS arrive and felt disoriented. His jaw hurt  for a week after, no focal weakness. He reports that both occurred on days of very vigorous activity, the first episode occurred in the setting of sleep deprivation, he reports getting a full 8 hours prior to the second event, but was sleep deprived 2 nights prior. There was no alcohol with the first event, he had 1 beer 2 nights prior to the second one. No illicit substances. He denies any olfactory/gustatory hallucinations, deja vu,  rising epigastric sensation, focal numbness/tingling/weakness, myoclonic jerks. He denies any headaches, dizziness, diplopia, dysarthria/dysphagia, neck/back pain, bowel/bladder dysfunction. 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   Epilepsy Risk Factors:  His brother had a GTCs (febrile) at age 43. Otherwise he had a normal birth and early development.  There is no history of febrile convulsions, CNS infections such as meningitis/encephalitis, significant traumatic brain injury, neurosurgical procedures.  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 Abnormal discharges noted to be in runs up to 7-10 seconds.  EEG in August 2021 abnormal due to occasional generalized high voltage 3-4 Hz spike and polyspike and wave discharges with frontal predominance lasting 1-3 seconds  PAST MEDICAL HISTORY: Past Medical History:  Diagnosis Date   Epilepsy (HCC) 05/23/2017    MEDICATIONS: Current Outpatient Medications on File Prior to Visit  Medication Sig Dispense Refill   LamoTRIgine 200 MG TB24 24 hour tablet TAKE 2 TABLETS BY MOUTH TWICE A DAY 360 tablet 3   levETIRAcetam (KEPPRA) 1000 MG tablet Take 1.5 tablets (1500 mg) twice a day (Patient taking differently: 1,000 mg. Take 1 tablets (1000 mg) twice a day)     No current facility-administered medications on file prior to visit.    ALLERGIES: No Known Allergies  FAMILY HISTORY: Family History  Problem Relation Age of Onset   Coronary artery disease Father        triple bypass    SOCIAL HISTORY: Social History   Socioeconomic History   Marital status: Single    Spouse name: Not on file   Number of children: Not on file   Years of  education: Not on file   Highest education level: Not on file  Occupational History    Comment: in college  Tobacco Use   Smoking status: Never   Smokeless tobacco: Never  Vaping Use   Vaping status: Never Used  Substance and Sexual Activity   Alcohol use: Not Currently    Comment: wine once a week   Drug use: No   Sexual activity: Not on file  Other Topics Concern   Not on file  Social History Narrative   Lives with sister   In college [employer]   No caffeine    No children   Right handed    Lives in a complex with elevators   Social Drivers of Health   Financial Resource Strain: Not on file  Food Insecurity: No Food Insecurity (02/21/2024)   Hunger Vital Sign    Worried About Running Out of Food in the Last Year: Never true    Ran Out of Food in the Last Year: Never true  Transportation Needs: No Transportation Needs (02/21/2024)   PRAPARE - Administrator, Civil Service (Medical): No    Lack of Transportation (Non-Medical):  No  Physical Activity: Not on file  Stress: Not on file  Social Connections: Not on file  Intimate Partner Violence: Not At Risk (02/21/2024)   Humiliation, Afraid, Rape, and Kick questionnaire    Fear of Current or Ex-Partner: No    Emotionally Abused: No    Physically Abused: No    Sexually Abused: No     PHYSICAL EXAM: Vitals:   03/03/24 0957  BP: 136/79  Pulse: 88  SpO2: 99%   General: No acute distress Head:  Normocephalic/atraumatic Skin/Extremities: No rash, no edema Neurological Exam: alert and awake. No aphasia or dysarthria. Fund of knowledge is appropriate. Attention and concentration are normal.   Cranial nerves: Pupils equal, round. Extraocular movements intact. No facial asymmetry.  Motor: moves all extremities symmetrically at least anti-gravity x 4.    IMPRESSION: This is a pleasant 28 yo RH man with primary generalized epilepsy. He had 4 seizures in one day on 02/20/24 in the setting of GI illness. He is reporting  vision side effects with Levetiracetam and cannot tolerate increase in dose. We had an extensive discussion about medications, he is concerned about liver enzyme elevation during hospitalization that did improve the next day, I discussed this may have been reactive from his GI illness, but we will recheck CMP today. If abnormal, he will be referred to GI. We discussed that there are other options for seizure medications, he was given a list and will let us know which he is comfortable with starting. Once we start new medication, he will start weaning down Keppra and monitor vision changes off Keppra. He is aware of [place] driving laws to stop driving after a seizure until 6 months seizure-free. Follow-up in 2-3 months, call for any changes.    Thank you for allowing me to participate in his care.  Please do not hesitate to call for any questions or concerns.    Patrcia Dolly, M.D.   CC: Dr. Lewie Chamber

## 2024-03-03 NOTE — Patient Instructions (Addendum)
 Good to see you.  Have bloodwork for CMP  2. Continue Lamotrigine 400mg  twice a day and Levetiracetam 1000mg  twice a day for now  3. Here are the medication options, let me know which you prefer: - Zonisamide (Zonegran) - Topiramate (Topamax) - Lacosamide (Vimpat) -Perampanel (Fycompa)  Once you decide, we will plan to start new medication then plan to wean off Keppra  4. Follow-up in 2-3 months, call for any changes

## 2024-03-04 ENCOUNTER — Encounter: Payer: Self-pay | Admitting: Neurology

## 2024-03-04 LAB — COMPREHENSIVE METABOLIC PANEL
AG Ratio: 1.8 (calc) (ref 1.0–2.5)
ALT: 58 U/L — ABNORMAL HIGH (ref 9–46)
AST: 27 U/L (ref 10–40)
Albumin: 4.7 g/dL (ref 3.6–5.1)
Alkaline phosphatase (APISO): 100 U/L (ref 36–130)
BUN: 9 mg/dL (ref 7–25)
CO2: 29 mmol/L (ref 20–32)
Calcium: 9.9 mg/dL (ref 8.6–10.3)
Chloride: 105 mmol/L (ref 98–110)
Creat: 0.97 mg/dL (ref 0.60–1.24)
Globulin: 2.6 g/dL (ref 1.9–3.7)
Glucose, Bld: 78 mg/dL (ref 65–99)
Potassium: 4 mmol/L (ref 3.5–5.3)
Sodium: 142 mmol/L (ref 135–146)
Total Bilirubin: 0.5 mg/dL (ref 0.2–1.2)
Total Protein: 7.3 g/dL (ref 6.1–8.1)

## 2024-03-08 ENCOUNTER — Encounter: Payer: Self-pay | Admitting: Physician Assistant

## 2024-03-08 ENCOUNTER — Telehealth: Payer: Self-pay | Admitting: Neurology

## 2024-03-08 DIAGNOSIS — R748 Abnormal levels of other serum enzymes: Secondary | ICD-10-CM

## 2024-03-08 MED ORDER — LACOSAMIDE 50 MG PO TABS: 50.0000 mg | ORAL_TABLET | Freq: Two times a day (BID) | ORAL | 5 refills | Status: DC

## 2024-03-08 NOTE — Addendum Note (Signed)
 Addended by: Dimas Chyle on: 03/08/2024 04:03 PM   Modules accepted: Orders

## 2024-03-08 NOTE — Telephone Encounter (Signed)
 Pls send referral for elevated liver enzymes, thank you!!

## 2024-03-08 NOTE — Telephone Encounter (Signed)
 Patient wants to know where we sent the GI referral to so he can call and get the appt.

## 2024-03-15 ENCOUNTER — Other Ambulatory Visit: Payer: Self-pay

## 2024-03-15 MED ORDER — LAMOTRIGINE ER 200 MG PO TB24: ORAL_TABLET | ORAL | 1 refills | Status: DC

## 2024-03-17 DIAGNOSIS — E559 Vitamin D deficiency, unspecified: Secondary | ICD-10-CM | POA: Diagnosis not present

## 2024-04-30 ENCOUNTER — Encounter: Payer: Self-pay | Admitting: Physician Assistant

## 2024-04-30 ENCOUNTER — Other Ambulatory Visit (INDEPENDENT_AMBULATORY_CARE_PROVIDER_SITE_OTHER)

## 2024-04-30 ENCOUNTER — Ambulatory Visit: Admitting: Physician Assistant

## 2024-04-30 VITALS — BP 118/70 | HR 57 | Ht 68.0 in | Wt 153.0 lb

## 2024-04-30 DIAGNOSIS — R7989 Other specified abnormal findings of blood chemistry: Secondary | ICD-10-CM

## 2024-04-30 DIAGNOSIS — R569 Unspecified convulsions: Secondary | ICD-10-CM

## 2024-04-30 LAB — HEPATIC FUNCTION PANEL
ALT: 32 U/L (ref 0–53)
AST: 17 U/L (ref 0–37)
Albumin: 4.7 g/dL (ref 3.5–5.2)
Alkaline Phosphatase: 92 U/L (ref 39–117)
Bilirubin, Direct: 0.1 mg/dL (ref 0.0–0.3)
Total Bilirubin: 0.4 mg/dL (ref 0.2–1.2)
Total Protein: 7.2 g/dL (ref 6.0–8.3)

## 2024-04-30 LAB — IBC + FERRITIN
Ferritin: 7 ng/mL — ABNORMAL LOW (ref 22.0–322.0)
Iron: 107 ug/dL (ref 42–165)
Saturation Ratios: 23.7 % (ref 20.0–50.0)
TIBC: 452.2 ug/dL — ABNORMAL HIGH (ref 250.0–450.0)
Transferrin: 323 mg/dL (ref 212.0–360.0)

## 2024-04-30 LAB — GAMMA GT: GGT: 72 U/L — ABNORMAL HIGH (ref 7–51)

## 2024-04-30 NOTE — Patient Instructions (Signed)
 _______________________________________________________  If your blood pressure at your visit was 140/90 or greater, please contact your primary care physician to follow up on this. _______________________________________________________  If you are age 26 or younger, your body mass index should be between 19-25. Your Body mass index is 23.26 kg/m. If this is out of the aformentioned range listed, please consider follow up with your Primary Care Provider.  ________________________________________________________  The [facility] providers would like to encourage you to use MYCHART to communicate with providers for non-urgent requests or questions.  Due to long hold times on the telephone, sending your provider a message by [facility] may be a faster and more efficient way to get a response.  Please allow 48 business hours for a response.  Please remember that this is for non-urgent requests.  _______________________________________________________  Your provider has requested that you go to the basement level for lab work before leaving today. Press "B" on the elevator. The lab is located at the first door on the left as you exit the elevator.  You have been scheduled for an abdominal ultrasound at [facility] (1st floor of hospital) on 05-05-24 at 10:30AM. Please arrive 15 minutes prior to your appointment for registration. Make certain not to have anything to eat or drink 6 hours prior to your appointment. Should you need to reschedule your appointment, please contact radiology at [phone]. This test typically takes about 30 minutes to perform.  Due to recent changes in healthcare laws, you may see the results of your imaging and laboratory studies on MyChart before your provider has had a chance to review them.  We understand that in some cases there may be results that are confusing or concerning to you. Not all laboratory results come back in the same time frame and the provider may be  waiting for multiple results in order to interpret others.  Please give us  48 hours in order for your provider to thoroughly review all the results before contacting the office for clarification of your results.   VISIT SUMMARY:  During your visit, we discussed your elevated liver function tests and your history of seizures. We reviewed your current medications, Keppra  and Vimpat , and their potential side effects. We also talked about the need for further testing to determine the cause of your elevated liver enzymes.  YOUR PLAN:  -ELEVATED LIVER ENZYMES: Elevated liver enzymes can indicate liver inflammation or damage. This may be related to your medications, Keppra  and Vimpat . We will order an abdominal ultrasound to check your liver, bile ducts, and gallbladder. We will also test for autoimmune markers and celiac disease. Depending on the results, we may need to adjust your medications.  -SEIZURES: Seizures are episodes of abnormal electrical activity in the brain. You have been experiencing seizures about once a year. We will continue your current medications, Keppra  and Vimpat , and monitor for any side effects. If needed, we will adjust your medication dosage.  INSTRUCTIONS:  Please schedule an abdominal ultrasound and lab tests for autoimmune markers and celiac disease. Continue taking your current medications, Keppra  and Vimpat , and monitor for any side effects. If you experience any new symptoms or side effects, please contact our office. Follow up with us  after your tests are completed to review the results and discuss any necessary changes to your treatment plan.  Thank you for entrusting me with your care and choosing [facility].  Santina Cull, PA-C

## 2024-04-30 NOTE — Progress Notes (Signed)
 04/30/2024 Peter Shaw [id number] 07/02/1996  Referring provider: Jhonny Moss, MD Primary GI doctor: Dr. Yvone Herd  ASSESSMENT AND PLAN:  Elevated LFTs Patient had elevated LFT elevation 2 months ago 02/20/2024 unremarkable prior to that time, initial AST elevation then ALT, normal alk phos and total bilirubin, occurred during hospitalization for seizures treated with IV Keppra  and Vimpat  in the ER, no imaging, Lamictal  level 5.9, Keppra  11.6 Negative acute hepatitis panel 02/20/2024 AST 75, ALT 53, total bilirubin 0.9, had leukocytosis at the time with WBC 13 02/21/2024 AST 51, ALT 42, alk phos 100, total bilirubin 0.9 03/03/2024 AST 27, ALT 58, alk phos 100, total bilirubin 0.5  Platelets 280     Latest Ref Rng & Units 03/03/2024   11:08 AM 02/21/2024    3:58 AM 02/20/2024    8:51 PM  Hepatic Function  Total Protein 6.1 - 8.1 g/dL 7.3  6.9  8.3   Albumin 3.5 - 5.0 g/dL  4.2  5.1   AST 10 - 40 U/L 27  51  75   ALT 9 - 46 U/L 58  42  53   Alk Phosphatase 38 - 126 U/L  89  111   Total Bilirubin 0.2 - 1.2 mg/dL 0.5  0.9  0.9   Elevated liver enzymes potentially linked to medication use, including IV Keppra  and Vimpat . Differential diagnosis includes drug-induced liver injury, autoimmune or celiac disease. Imaging and further testing needed to exclude fatty liver, gallbladder issues, and autoimmune conditions. - Order abdominal ultrasound to evaluate liver, bile ducts, and gallbladder. - Check for autoimmune markers and celiac disease. - Monitor liver enzyme levels and consider medication adjustment if necessary.  History of seizure disorder with breakthrough seizures On Vimpat  50 mg, Lamictal  200 mg, Keppra  1000 mg  Screening colonoscopy age 88 Unless sooner symptoms  Patient Care Team: Benedetto Brady, MD as PCP - General (Family Medicine) Jhonny Moss, MD as Consulting Physician (Neurology)  HISTORY OF PRESENT ILLNESS: 27 y.o. male with a past medical history listed below  presents for evaluation of elevated LFTs.   Discussed the use of AI scribe software for clinical note transcription with the patient, who gave verbal consent to proceed.  History of Present Illness   Peter Shaw is a 28 year old male with a history of seizures who presents with elevated liver function tests.  He has elevated liver function tests, first noted during a hospital admission for seizures. During this admission, he received IV Keppra  and Vimpat , which coincided with the elevation of liver enzymes. His liver function tests were normal in 2022.  He experiences seizures approximately once a year without a clear cause. He is currently taking Keppra  and Vimpat , with a recent dose of 450 mg of Vimpat . He is experiencing side effects from Keppra , including delusion and nausea, and is considering adjusting his dosage from 1000 mg to 500 mg.  No family history of liver issues. He consumes alcohol minimally, about once a month. No history of tattoos or blood transfusions. No symptoms of jaundice, abdominal swelling, leg swelling, clay-colored stools, dark urine, or generalized itching.  He experiences nausea and vomiting infrequently, about once a month, often associated with medication intake, especially if taken on an empty stomach. No abdominal pain, right shoulder blade pain, bloating, heartburn, or changes in bowel habits.     He  reports that he has never smoked. He has never used smokeless tobacco. He reports that he does not currently use alcohol. He reports  that he does not use drugs.  RELEVANT GI HISTORY, IMAGING AND LABS: Results   LABS AST: 70 U/L ALT: 50 U/L      CBC    Component Value Date/Time   WBC 7.1 02/21/2024 0358   RBC 5.08 02/21/2024 0358   HGB 14.7 02/21/2024 0358   HCT 43.5 02/21/2024 0358   PLT 280 02/21/2024 0358   MCV 85.6 02/21/2024 0358   MCH 28.9 02/21/2024 0358   MCHC 33.8 02/21/2024 0358   RDW 12.7 02/21/2024 0358   LYMPHSABS 0.6 (L) 02/21/2024 0358    MONOABS 0.7 02/21/2024 0358   EOSABS 0.0 02/21/2024 0358   BASOSABS 0.0 02/21/2024 0358   Recent Labs    02/20/24 2051 02/20/24 2100 02/21/24 0358  HGB 16.9 17.3* 14.7    CMP     Component Value Date/Time   NA 142 03/03/2024 1108   K 4.0 03/03/2024 1108   CL 105 03/03/2024 1108   CO2 29 03/03/2024 1108   GLUCOSE 78 03/03/2024 1108   BUN 9 03/03/2024 1108   CREATININE 0.97 03/03/2024 1108   CALCIUM 9.9 03/03/2024 1108   PROT 7.3 03/03/2024 1108   ALBUMIN 4.2 02/21/2024 0358   AST 27 03/03/2024 1108   ALT 58 (H) 03/03/2024 1108   ALKPHOS 89 02/21/2024 0358   BILITOT 0.5 03/03/2024 1108   GFRNONAA >60 02/21/2024 0358      Latest Ref Rng & Units 03/03/2024   11:08 AM 02/21/2024    3:58 AM 02/20/2024    8:51 PM  Hepatic Function  Total Protein 6.1 - 8.1 g/dL 7.3  6.9  8.3   Albumin 3.5 - 5.0 g/dL  4.2  5.1   AST 10 - 40 U/L 27  51  75   ALT 9 - 46 U/L 58  42  53   Alk Phosphatase 38 - 126 U/L  89  111   Total Bilirubin 0.2 - 1.2 mg/dL 0.5  0.9  0.9       Current Medications:        Current Outpatient Medications (Other):    lacosamide  (VIMPAT ) 50 MG TABS tablet, Take 1 tablet (50 mg total) by mouth 2 (two) times daily.   LamoTRIgine  200 MG TB24 24 hour tablet, TAKE 2 TABLETS BY MOUTH TWICE A DAY   levETIRAcetam  (KEPPRA ) 1000 MG tablet, Take 1 tablet (1,000 mg total) by mouth 2 (two) times daily. Take 1 tablets (1000 mg) twice a day  Medical History:  Past Medical History:  Diagnosis Date   Epilepsy (HCC) 05/23/2017   Allergies: No Known Allergies   Surgical History:  He  has a past surgical history that includes Circumcision. Family History:  His family history includes Coronary artery disease in his father.  REVIEW OF SYSTEMS  : All other systems reviewed and negative except where noted in the History of Present Illness.  PHYSICAL EXAM: BP 118/70   Pulse (!) 57   Ht 5\' 8"  (1.727 m)   Wt 153 lb (69.4 kg)   BMI 23.26 kg/m  Physical Exam    GENERAL APPEARANCE: Well nourished, in no apparent distress. HEENT: No cervical lymphadenopathy, unremarkable thyroid, sclerae anicteric, conjunctiva pink. RESPIRATORY: Respiratory effort normal, BS equal bilateral without rales, rhonchi, wheezing. CARDIO: RRR with no MRGs, peripheral pulses intact. ABDOMEN: Soft, non distended, active bowel sounds in all 4 quadrants, non-tender, no rebound, no mass appreciated. RECTAL: Declines. MUSCULOSKELETAL: Full ROM, normal gait, normal posture, without edema. SKIN: Dry, intact without rashes or lesions. No jaundice.  NEURO: Alert, oriented, no focal deficits. PSYCH: Cooperative, normal mood and affect.      Edmonia Gottron, PA-C 3:26 PM

## 2024-05-04 ENCOUNTER — Ambulatory Visit: Payer: Self-pay | Admitting: Physician Assistant

## 2024-05-04 ENCOUNTER — Other Ambulatory Visit: Payer: Self-pay

## 2024-05-04 DIAGNOSIS — R7989 Other specified abnormal findings of blood chemistry: Secondary | ICD-10-CM

## 2024-05-04 DIAGNOSIS — R790 Abnormal level of blood mineral: Secondary | ICD-10-CM

## 2024-05-04 LAB — MITOCHONDRIAL ANTIBODIES: Mitochondrial M2 Ab, IgG: <=20 U (ref <=20.0)

## 2024-05-04 LAB — IGG: IgG (Immunoglobin G), Serum: 921 mg/dL (ref 600–1640)

## 2024-05-04 LAB — ALPHA-1-ANTITRYPSIN: A-1 Antitrypsin, Ser: 139 mg/dL (ref 83–199)

## 2024-05-04 LAB — ANA: Anti Nuclear Antibody (ANA): NEGATIVE

## 2024-05-04 LAB — CERULOPLASMIN: Ceruloplasmin: 23 mg/dL (ref 14–30)

## 2024-05-04 LAB — IGA: Immunoglobulin A: 174 mg/dL (ref 47–310)

## 2024-05-04 LAB — ANTI-SMOOTH MUSCLE ANTIBODY, IGG: Actin (Smooth Muscle) Antibody (IGG): <20 U (ref <20)

## 2024-05-04 LAB — TISSUE TRANSGLUTAMINASE, IGA: (tTG) Ab, IgA: <1 U/mL

## 2024-05-05 ENCOUNTER — Ambulatory Visit (HOSPITAL_COMMUNITY)
Admission: RE | Admit: 2024-05-05 | Discharge: 2024-05-05 | Disposition: A | Discharge status: Home / Self Care | Source: Ambulatory Visit | Attending: Physician Assistant | Admitting: Physician Assistant

## 2024-05-05 DIAGNOSIS — R7989 Other specified abnormal findings of blood chemistry: Secondary | ICD-10-CM | POA: Insufficient documentation

## 2024-05-05 DIAGNOSIS — R748 Abnormal levels of other serum enzymes: Secondary | ICD-10-CM | POA: Diagnosis not present

## 2024-05-06 ENCOUNTER — Telehealth: Payer: Self-pay | Admitting: Neurology

## 2024-05-06 DIAGNOSIS — Z79899 Other long term (current) drug therapy: Secondary | ICD-10-CM

## 2024-05-06 DIAGNOSIS — G40309 Generalized idiopathic epilepsy and epileptic syndromes, not intractable, without status epilepticus: Secondary | ICD-10-CM

## 2024-05-06 NOTE — Telephone Encounter (Signed)
 Pt wants to speak to some one about the keppra  and the vimpat 

## 2024-05-06 NOTE — Telephone Encounter (Signed)
 Pt is asking if you want him to get blood work before his appointment to see what his levels are?  He wants to know if you want him to continue at his dose of keppra  he said that he is not having the side effects he was having on it,

## 2024-05-11 NOTE — Telephone Encounter (Signed)
 Pt called back in to follow up on what Dr. Ty Gales wants him to do about his meds.

## 2024-05-11 NOTE — Telephone Encounter (Signed)
 That is fine, ok to check Lamotrigine  and Levetiracetam  levels, thanks

## 2024-05-12 NOTE — Telephone Encounter (Signed)
 Pt called an informed that we are going to check his Lamotrigine  and Levetiracetam  levels

## 2024-05-12 NOTE — Addendum Note (Signed)
 Addended by: Erica Hau on: 05/12/2024 08:02 AM   Modules accepted: Orders

## 2024-05-14 ENCOUNTER — Other Ambulatory Visit

## 2024-05-14 DIAGNOSIS — R790 Abnormal level of blood mineral: Secondary | ICD-10-CM

## 2024-05-14 DIAGNOSIS — K219 Gastro-esophageal reflux disease without esophagitis: Secondary | ICD-10-CM | POA: Diagnosis not present

## 2024-05-14 DIAGNOSIS — R7989 Other specified abnormal findings of blood chemistry: Secondary | ICD-10-CM | POA: Diagnosis not present

## 2024-05-16 LAB — H. PYLORI ANTIGEN, STOOL: H pylori Ag, Stl: NEGATIVE

## 2024-05-18 ENCOUNTER — Ambulatory Visit (INDEPENDENT_AMBULATORY_CARE_PROVIDER_SITE_OTHER)

## 2024-05-18 ENCOUNTER — Ambulatory Visit: Payer: Self-pay | Admitting: Physician Assistant

## 2024-05-18 DIAGNOSIS — R7989 Other specified abnormal findings of blood chemistry: Secondary | ICD-10-CM

## 2024-05-18 DIAGNOSIS — R790 Abnormal level of blood mineral: Secondary | ICD-10-CM

## 2024-05-18 LAB — HEMOCCULT SLIDES (X 3 CARDS)
Fecal Occult Blood: NEGATIVE
OCCULT 1: NEGATIVE
OCCULT 2: NEGATIVE
OCCULT 3: NEGATIVE
OCCULT 4: NEGATIVE
OCCULT 5: NEGATIVE

## 2024-06-14 ENCOUNTER — Ambulatory Visit (INDEPENDENT_AMBULATORY_CARE_PROVIDER_SITE_OTHER): Admitting: Neurology

## 2024-06-14 ENCOUNTER — Encounter: Payer: Self-pay | Admitting: Neurology

## 2024-06-14 VITALS — BP 133/82 | HR 83 | Ht 68.0 in | Wt 156.4 lb

## 2024-06-14 DIAGNOSIS — G40309 Generalized idiopathic epilepsy and epileptic syndromes, not intractable, without status epilepticus: Secondary | ICD-10-CM

## 2024-06-14 MED ORDER — LAMOTRIGINE ER 200 MG PO TB24: ORAL_TABLET | ORAL | 3 refills | Status: DC

## 2024-06-14 MED ORDER — LACOSAMIDE 100 MG PO TABS: ORAL_TABLET | ORAL | 5 refills | Status: DC

## 2024-06-14 NOTE — Patient Instructions (Signed)
 Good to see you.  Start weaning off Levetiracetam  1000mg : take 1/2 tablet in AM, 1 tablet in PM for 1 week, then reduce to 1/2 tablet twice a day for 1 week, then reduce to 1/2 tablet every night for 1 week, then stop Keppra   2. Increase Lacosamide  to 100mg  twice a day. Check level in a week  3. Continue Lamotrigine  ER 200mg : Take 2 tablets twice a day  4. Follow-up as scheduled next month, call for any changes   Seizure Precautions: 1. If medication has been prescribed for you to prevent seizures, take it exactly as directed.  Do not stop taking the medicine without talking to your doctor first, even if you have not had a seizure in a long time.   2. Avoid activities in which a seizure would cause danger to yourself or to others.  Don't operate dangerous machinery, swim alone, or climb in high or dangerous places, such as on ladders, roofs, or girders.  Do not drive unless your doctor says you may.  3. If you have any warning that you may have a seizure, lay down in a safe place where you can't hurt yourself.    4.  No driving for 6 months from last seizure, as per [place]  state law.   Please refer to the following link on the Epilepsy Foundation of America's website for more information: http://www.epilepsyfoundation.org/answerplace/Social/driving/drivingu.cfm   5.  Maintain good sleep hygiene. Avoid alcohol.  6.  Contact your doctor if you have any problems that may be related to the medicine you are taking.  7.  Call 911 and bring the patient back to the ED if:        A.  The seizure lasts longer than 5 minutes.       B.  The patient doesn't awaken shortly after the seizure  C.  The patient has new problems such as difficulty seeing, speaking or moving  D.  The patient was injured during the seizure  E.  The patient has a temperature over 102 F (39C)  F.  The patient vomited and now is having trouble breathing

## 2024-06-14 NOTE — Progress Notes (Signed)
 NEUROLOGY FOLLOW UP OFFICE NOTE  Peter Shaw [id number] 1996-04-22  HISTORY OF PRESENT ILLNESS: I had the pleasure of seeing Peter Shaw in follow-up in the neurology clinic on 06/14/2024.  The patient was last seen 3 months ago for Primary Generalized Epilepsy. He is alone in the office today. Records and images were personally reviewed where available. He denies any seizures since 02/20/24. He was reporting visual side effects and concerned about elevated LFTs due to Levetiracetam . He is on Levetiracetam  1000mg  BID, Lamotrigine  ER 400mg  BID (200mg  2 tabs BID), and Lacosamide  50mg  BID (started after 01/2024 seizures). He states the visual side effects have resolved but he would like to wean off LEV. He has seen GI for the elevated LFTs and had a normal abdominal ultrasound, his last LFTs in 04/2024 were normal. Per GI notes, looks like the elevated liver function is likely from your medicine.   He denies any staring/unresponsive episodes, gaps in time, olfactory/gustatory hallucinations, focal numbness/tingling/weakness, myoclonic jerks. No headaches, dizziness, vision changes, no falls. He gets 7 hours of sleep. Mood is tired.   History on Initial Assessment 01/26/2020: This is a pleasant 28 year old right-handed man presenting for second opinion regarding seizures. Records from Peter Shaw were reviewed. The first seizure occurred at age 28 right after graduation. he was preparing for a trip and woke up early at 4:30am, there were no warning symptoms, he woke up in the hospital with no memory of events. His mother had heard him fall and witnessed a GTC lasting 5 minutes. He was restless when EMS arrived, confused and disoriented, and brought to a hospital in [place]. He had an MRI and EEG. Per Peter Shaw note, EEG in June 2018 was apparently suggestive of generalized epilepsy with 2.5-3 Hz generalized spikes during hyperventilation, bifrontally predominant spike and wave in sleep, and a  single left frontal spike. Abnormal discharges noted to be in runs up to 7-10 seconds. MRI brain reportedly unremarkable. He was given a prescription for Keppra  which he did not take. He traveled to [place] with no issues. The next seizure occurred around 2 years later in December 2020, again around 4am. He recalls falling asleep on the couch, he woke up at 3am, took a shower, then went back to bed at 4am. Around 10 minutes of falling asleep, his roommate heard a loud groan and found him off the bed. There was no report of shaking, he had rolled over, stiff, and was not responding for 5 minutes. No tongue bite or incontinence, he recalls seeing EMS arrive and felt disoriented. His jaw hurt for a week after, no focal weakness. He reports that both occurred on days of very vigorous activity, the first episode occurred in the setting of sleep deprivation, he reports getting a full 8 hours prior to the second event, but was sleep deprived 2 nights prior. There was no alcohol with the first event, he had 1 beer 2 nights prior to the second one. No illicit substances. He denies any olfactory/gustatory hallucinations, deja vu, rising epigastric sensation, focal numbness/tingling/weakness, myoclonic jerks. He denies any headaches, dizziness, diplopia, dysarthria/dysphagia, neck/back pain, bowel/bladder dysfunction. 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   Epilepsy Risk Factors:  His brother had a GTCs (febrile) at age 44. Otherwise he had a normal birth and early development.  There is no history of febrile convulsions, CNS infections such as meningitis/encephalitis, significant traumatic brain injury, neurosurgical procedures.  Diagnostic Data: MRI brain without contrast 2018:  no acute changes, small benign pars intermedia cyst of the pituitary gland EEG in June 2018 was apparently suggestive of generalized epilepsy with 2.5-3 Hz generalized spikes during hyperventilation, bifrontally predominant  spike and wave in sleep, and a single left frontal spike. Abnormal discharges noted to be in runs up to 7-10 seconds.  EEG in August 2021 abnormal due to occasional generalized high voltage 3-4 Hz spike and polyspike and wave discharges with frontal predominance lasting 1-3 seconds  PAST MEDICAL HISTORY: Past Medical History:  Diagnosis Date   Epilepsy (HCC) 05/23/2017    MEDICATIONS: Current Outpatient Medications on File Prior to Visit  Medication Sig Dispense Refill   lacosamide  (VIMPAT ) 50 MG TABS tablet Take 1 tablet (50 mg total) by mouth 2 (two) times daily. 60 tablet 5   LamoTRIgine  200 MG TB24 24 hour tablet TAKE 2 TABLETS BY MOUTH TWICE A DAY 360 tablet 1   levETIRAcetam  (KEPPRA ) 1000 MG tablet Take 1 tablet (1,000 mg total) by mouth 2 (two) times daily. Take 1 tablets (1000 mg) twice a day 180 tablet 3   No current facility-administered medications on file prior to visit.    ALLERGIES: No Known Allergies  FAMILY HISTORY: Family History  Problem Relation Age of Onset   Coronary artery disease Father        triple bypass   Colon cancer Neg Hx    Esophageal cancer Neg Hx    Stomach cancer Neg Hx     SOCIAL HISTORY: Social History   Socioeconomic History   Marital status: Single    Spouse name: Not on file   Number of children: 0   Years of education: Not on file   Highest education level: Not on file  Occupational History    Comment: in college   Occupation: [employer] employee  Tobacco Use   Smoking status: Never   Smokeless tobacco: Never  Vaping Use   Vaping status: Never Used  Substance and Sexual Activity   Alcohol use: Not Currently    Comment: wine once a week   Drug use: No   Sexual activity: Not on file  Other Topics Concern   Not on file  Social History Narrative   Lives with sister   In college [employer]   No caffeine    No children   Right handed    Lives in a complex with elevators   Social Drivers of Health   Financial Resource  Strain: Not on file  Food Insecurity: No Food Insecurity (02/21/2024)   Hunger Vital Sign    Worried About Running Out of Food in the Last Year: Never true    Ran Out of Food in the Last Year: Never true  Transportation Needs: No Transportation Needs (02/21/2024)   PRAPARE - Administrator, Civil Service (Medical): No    Lack of Transportation (Non-Medical): No  Physical Activity: Not on file  Stress: Not on file  Social Connections: Not on file  Intimate Partner Violence: Not At Risk (02/21/2024)   Humiliation, Afraid, Rape, and Kick questionnaire    Fear of Current or Ex-Partner: No    Emotionally Abused: No    Physically Abused: No    Sexually Abused: No     PHYSICAL EXAM: Vitals:   06/14/24 1606  BP: 133/82  Pulse: 83  SpO2: 100%   General: No acute distress Head:  Normocephalic/atraumatic Skin/Extremities: No rash, no edema Neurological Exam: alert and awake. No aphasia or dysarthria. Fund of knowledge is appropriate.  Attention and concentration are normal.   Cranial nerves: Pupils equal, round. Extraocular movements intact with no nystagmus. Visual fields full.  No facial asymmetry.  Motor: Bulk and tone normal, muscle strength 5/5 throughout with no pronator drift.   Finger to nose testing intact.  Gait narrow-based and steady, able to tandem walk adequately.  Romberg negative.   IMPRESSION: This is a pleasant 28 yo RH man with primary generalized epilepsy. He had 4 seizures in one day on 02/20/24 in the setting of GI illness. He is now on 3 ASMs and would like to wean off Levetiracetam . There was a transient increase in LFTs with normal GI workup, last LFTs were normal. Weaning instructions were provided to wean off by 1/2 tablet every week. We discussed increasing Lacosamide  to 100mg  BID. We discussed role of ASM levels in management of seizures, however he would like to have Lacosamide  level done for peace of mind, check in a week. Continue Lamotrigine  ER 400mg  BID.  He is aware of [place] driving laws to stop driving after a seizure until 6 months seizure-free. Follow-up as scheduled next month, call for any changes.   Thank you for allowing me to participate in his care.  Please do not hesitate to call for any questions or concerns.    Darice Shivers, M.D.   CC: Dr. Leonel

## 2024-07-07 ENCOUNTER — Ambulatory Visit: Payer: Federal, State, Local not specified - PPO | Admitting: Neurology

## 2024-07-07 ENCOUNTER — Encounter: Payer: Self-pay | Admitting: Neurology

## 2024-07-07 DIAGNOSIS — G40909 Epilepsy, unspecified, not intractable, without status epilepticus: Secondary | ICD-10-CM | POA: Diagnosis not present

## 2024-07-07 DIAGNOSIS — Z79899 Other long term (current) drug therapy: Secondary | ICD-10-CM | POA: Diagnosis not present

## 2024-07-07 DIAGNOSIS — R000 Tachycardia, unspecified: Secondary | ICD-10-CM | POA: Diagnosis not present

## 2024-07-07 DIAGNOSIS — G40309 Generalized idiopathic epilepsy and epileptic syndromes, not intractable, without status epilepticus: Secondary | ICD-10-CM | POA: Diagnosis not present

## 2024-07-07 DIAGNOSIS — R569 Unspecified convulsions: Secondary | ICD-10-CM | POA: Diagnosis not present

## 2024-07-07 NOTE — Progress Notes (Signed)
 NEUROLOGY FOLLOW UP OFFICE NOTE  Peter Shaw [id number] 1996-08-28  HISTORY OF PRESENT ILLNESS: I had the pleasure of seeing Peter Shaw in follow-up in the neurology clinic on 07/07/2024.  The patient was last seen 3 weeks ago for Primary Generalized Epilepsy. He is alone in the office today. Records and images were personally reviewed where available. On his last visit, he expressed desire to wean off the Levetiracetam , last dose was Sunday. Lacosamide  was increased to 100mg  BID. He continues on Lamotrigine  ER 400mg  BID (200mg  2 tabs BID). He denies any side effects and states he feels better off Levetiracetam . He denies any staring/unresponsive episodes, gaps in time, olfactory/gustatory hallucinations, focal numbness/tingling/weakness, myoclonic jerks. No headaches, dizziness, vision changes, no falls. He gets 6-7 hours of sleep. Mood is tired.   History on Initial Assessment 01/26/2020: This is a pleasant 28 year old right-handed man presenting for second opinion regarding seizures. Records from [name] were reviewed. The first seizure occurred at age 35 right after graduation. he was preparing for a trip and woke up early at 4:30am, there were no warning symptoms, he woke up in the hospital with no memory of events. His mother had heard him fall and witnessed a GTC lasting 5 minutes. He was restless when EMS arrived, confused and disoriented, and brought to a hospital in [place]. He had an MRI and EEG. Per [name] note, EEG in June 2018 was apparently suggestive of generalized epilepsy with 2.5-3 Hz generalized spikes during hyperventilation, bifrontally predominant spike and wave in sleep, and a single left frontal spike. Abnormal discharges noted to be in runs up to 7-10 seconds. MRI brain reportedly unremarkable. He was given a prescription for Keppra  which he did not take. He traveled to [place] with no issues. The next seizure occurred around 2 years later in December 2020,  again around 4am. He recalls falling asleep on the couch, he woke up at 3am, took a shower, then went back to bed at 4am. Around 10 minutes of falling asleep, his roommate heard a loud groan and found him off the bed. There was no report of shaking, he had rolled over, stiff, and was not responding for 5 minutes. No tongue bite or incontinence, he recalls seeing EMS arrive and felt disoriented. His jaw hurt for a week after, no focal weakness. He reports that both occurred on days of very vigorous activity, the first episode occurred in the setting of sleep deprivation, he reports getting a full 8 hours prior to the second event, but was sleep deprived 2 nights prior. There was no alcohol with the first event, he had 1 beer 2 nights prior to the second one. No illicit substances. He denies any olfactory/gustatory hallucinations, deja vu, rising epigastric sensation, focal numbness/tingling/weakness, myoclonic jerks. He denies any headaches, dizziness, diplopia, dysarthria/dysphagia, neck/back pain, bowel/bladder dysfunction. 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   Epilepsy Risk Factors:  His brother had a GTCs (febrile) at age 55. Otherwise he had a normal birth and early development.  There is no history of febrile convulsions, CNS infections such as meningitis/encephalitis, significant traumatic brain injury, neurosurgical procedures.  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 Abnormal discharges noted to be in runs up  to 7-10 seconds.  EEG in August 2021 abnormal due to occasional generalized high voltage 3-4 Hz spike and polyspike and wave discharges with frontal predominance lasting 1-3  seconds  PAST MEDICAL HISTORY: Past Medical History:  Diagnosis Date   Epilepsy (HCC) 05/23/2017    MEDICATIONS: Current Outpatient Medications on File Prior to Visit  Medication Sig Dispense Refill   Lacosamide  100 MG TABS Take 1 tablet twice a day 60 tablet 5   LamoTRIgine  200 MG TB24 24 hour tablet TAKE 2 TABLETS BY MOUTH TWICE A DAY 360 tablet 3   No current facility-administered medications on file prior to visit.    ALLERGIES: No Known Allergies  FAMILY HISTORY: Family History  Problem Relation Age of Onset   Coronary artery disease Father        triple bypass   Colon cancer Neg Hx    Esophageal cancer Neg Hx    Stomach cancer Neg Hx     SOCIAL HISTORY: Social History   Socioeconomic History   Marital status: Single    Spouse name: Not on file   Number of children: 0   Years of education: Not on file   Highest education level: Not on file  Occupational History    Comment: in college   Occupation: [employer] employee  Tobacco Use   Smoking status: Never   Smokeless tobacco: Never  Vaping Use   Vaping status: Never Used  Substance and Sexual Activity   Alcohol use: Not Currently    Comment: wine once a week   Drug use: No   Sexual activity: Not on file  Other Topics Concern   Not on file  Social History Narrative   Lives with sister   In college [employer]   No caffeine    No children   Right handed    Lives in a complex with elevators   Social Drivers of Health   Financial Resource Strain: Not on file  Food Insecurity: No Food Insecurity (02/21/2024)   Hunger Vital Sign    Worried About Running Out of Food in the Last Year: Never true    Ran Out of Food in the Last Year: Never true  Transportation Needs: No Transportation Needs (02/21/2024)   PRAPARE - Administrator, Civil Service (Medical): No    Lack of Transportation (Non-Medical): No  Physical Activity: Not on file  Stress: Not on file  Social Connections: Not on file  Intimate  Partner Violence: Not At Risk (02/21/2024)   Humiliation, Afraid, Rape, and Kick questionnaire    Fear of Current or Ex-Partner: No    Emotionally Abused: No    Physically Abused: No    Sexually Abused: No     PHYSICAL EXAM: General: No acute distress Head:  Normocephalic/atraumatic Skin/Extremities: No rash, no edema Neurological Exam: alert and awake. No aphasia or dysarthria. Fund of knowledge is appropriate. Attention and concentration are normal.   Cranial nerves: Pupils equal, round. Extraocular movements intact with no nystagmus. Visual fields full.  No facial asymmetry.  Motor: Bulk and tone normal, muscle strength 5/5 throughout with no pronator drift.   Finger to nose testing intact.  Gait narrow-based and steady, able to tandem walk adequately.  Romberg negative.   IMPRESSION: This is a pleasant 28 yo RH man with primary generalized epilepsy. He had 4 seizures in one day on 02/20/24 in the setting of GI illness. He has weaned off Levetiracetam , currently on Lacosamide  100mg  BID and Lamotrigine  ER 400mg  BID,  seizure-free since 02/20/24. He would like Lacosamide  level and Levetiracetam  level (off since Sunday) done for reassurance. He is aware of [place] driving laws and will contact our office once 6 months seizure-free at which point [facility] paperwork will be filled. Follow-up in 3 months, call for any changes.   Thank you for allowing me to participate in his care.  Please do not hesitate to call for any questions or concerns.    Darice Shivers, M.D.   CC: Dr. Leonel

## 2024-07-07 NOTE — Patient Instructions (Signed)
 Good to see you.  Have bloodwork done for Lacosamide  and Levetiracetam  levels when able  2. Continue all your medications  3. Follow-up in 3 months, call for any changes   Seizure Precautions: 1. If medication has been prescribed for you to prevent seizures, take it exactly as directed.  Do not stop taking the medicine without talking to your doctor first, even if you have not had a seizure in a long time.   2. Avoid activities in which a seizure would cause danger to yourself or to others.  Don't operate dangerous machinery, swim alone, or climb in high or dangerous places, such as on ladders, roofs, or girders.  Do not drive unless your doctor says you may.  3. If you have any warning that you may have a seizure, lay down in a safe place where you can't hurt yourself.    4.  No driving for 6 months from last seizure, as per [place]  state law.   Please refer to the following link on the Epilepsy Foundation of America's website for more information: http://www.epilepsyfoundation.org/answerplace/Social/driving/drivingu.cfm   5.  Maintain good sleep hygiene. Avoid alcohol.  6.  Contact your doctor if you have any problems that may be related to the medicine you are taking.  7.  Call 911 and bring the patient back to the ED if:        A.  The seizure lasts longer than 5 minutes.       B.  The patient doesn't awaken shortly after the seizure  C.  The patient has new problems such as difficulty seeing, speaking or moving  D.  The patient was injured during the seizure  E.  The patient has a temperature over 102 F (39C)  F.  The patient vomited and now is having trouble breathing

## 2024-07-12 ENCOUNTER — Other Ambulatory Visit

## 2024-07-16 LAB — LACOSAMIDE, SERUM/PLASMA: Lacosamide, Serum/Plasma: 2.5 ug/mL

## 2024-07-16 LAB — LEVETIRACETAM, IMMUNOASSAY: LEVETIRACETAM, IMMUNOASSAY: <2 ug/mL — ABNORMAL LOW (ref 6.0–46.0)

## 2024-07-23 ENCOUNTER — Ambulatory Visit: Payer: Self-pay | Admitting: Neurology

## 2024-07-23 DIAGNOSIS — R569 Unspecified convulsions: Secondary | ICD-10-CM | POA: Diagnosis not present

## 2024-07-23 DIAGNOSIS — R000 Tachycardia, unspecified: Secondary | ICD-10-CM | POA: Diagnosis not present

## 2024-08-24 ENCOUNTER — Telehealth: Payer: Self-pay | Admitting: Neurology

## 2024-08-24 NOTE — Telephone Encounter (Signed)
 Pt cld and needs a letter faxed to the [facility], letting them know he hasn't had any episodes in the last six months DMV Fax:[phone], if you need to call you can leave a voicemail

## 2024-08-24 NOTE — Telephone Encounter (Signed)
 Pls let him know that the [facility] has a set form for asking about when the last seizure was and what medications he is taking. We can fill this out for him, he will need to sign the first page that it is okay for him to send the [facility] his medical information. We will let him know when ready, thanks

## 2024-08-24 NOTE — Telephone Encounter (Signed)
 Some patients have the forms mailed to them, if they don't have it, we can just print it off the [facility] website. Thanks!

## 2024-08-25 NOTE — Telephone Encounter (Signed)
 Called patient and left a message for a call back.

## 2024-08-26 ENCOUNTER — Telehealth: Payer: Self-pay | Admitting: Neurology

## 2024-08-26 ENCOUNTER — Encounter: Payer: Self-pay | Admitting: Neurology

## 2024-08-26 NOTE — Telephone Encounter (Signed)
 Peter Shaw is faxing letter now.

## 2024-08-26 NOTE — Telephone Encounter (Signed)
 Pt would like the letter to the [facility] faxed to them at [phone] please call him to let him know when it is done

## 2024-08-26 NOTE — Telephone Encounter (Signed)
 I left message letter is written by Dr.Karen Georjean. To call if any quesitons.

## 2024-08-26 NOTE — Telephone Encounter (Signed)
 The letter is ready, I think he will need the forms but letter is done, we can fax and he can pick up for his records to follow-up with DMV. Thanks

## 2024-08-26 NOTE — Telephone Encounter (Signed)
 Notified pt --letter is ready and waiting for Dr Georjean signature.

## 2024-08-26 NOTE — Telephone Encounter (Signed)
 Pls let him know when letter is faxed, thanks

## 2024-09-16 ENCOUNTER — Telehealth: Payer: Self-pay | Admitting: Neurology

## 2024-09-16 NOTE — Telephone Encounter (Signed)
 Pt dropped off documents for DMV to be filled out. The documents are in  Dr. Georjean Box. Thanks

## 2024-09-17 NOTE — Telephone Encounter (Signed)
 Pt called in this afternoon to check to see had the DMV documents been completed. Pt will call back next week. Thanks

## 2024-09-20 NOTE — Telephone Encounter (Signed)
 Spoke to pt regarding DMV form --pt requested to be fax when complete.

## 2024-09-23 NOTE — Telephone Encounter (Signed)
 DMV form-is completed/signed, placed at the front desk.

## 2024-09-27 DIAGNOSIS — Z0279 Encounter for issue of other medical certificate: Secondary | ICD-10-CM

## 2024-10-11 ENCOUNTER — Encounter: Payer: Self-pay | Admitting: Neurology

## 2024-10-11 ENCOUNTER — Ambulatory Visit (INDEPENDENT_AMBULATORY_CARE_PROVIDER_SITE_OTHER): Admitting: Neurology

## 2024-10-11 VITALS — BP 125/81 | HR 77 | Ht 68.0 in | Wt 164.6 lb

## 2024-10-11 DIAGNOSIS — G40309 Generalized idiopathic epilepsy and epileptic syndromes, not intractable, without status epilepticus: Secondary | ICD-10-CM | POA: Diagnosis not present

## 2024-10-11 MED ORDER — LACOSAMIDE 100 MG PO TABS: ORAL_TABLET | ORAL | 3 refills | Status: AC

## 2024-10-11 MED ORDER — LAMOTRIGINE ER 200 MG PO TB24: ORAL_TABLET | ORAL | 3 refills | Status: AC

## 2024-10-11 NOTE — Patient Instructions (Signed)
 Good to see you doing well. Continue all your medications. Follow-up in 6 months, call for any changes.    Seizure Precautions: 1. If medication has been prescribed for you to prevent seizures, take it exactly as directed.  Do not stop taking the medicine without talking to your doctor first, even if you have not had a seizure in a long time.   2. Avoid activities in which a seizure would cause danger to yourself or to others.  Don't operate dangerous machinery, swim alone, or climb in high or dangerous places, such as on ladders, roofs, or girders.  Do not drive unless your doctor says you may.  3. If you have any warning that you may have a seizure, lay down in a safe place where you can't hurt yourself.    4.  No driving for 6 months from last seizure, as per [place]  state law.   Please refer to the following link on the Epilepsy Foundation of America's website for more information: http://www.epilepsyfoundation.org/answerplace/Social/driving/drivingu.cfm   5.  Maintain good sleep hygiene. Avoid alcohol  6.  Contact your doctor if you have any problems that may be related to the medicine you are taking.  7.  Call 911 and bring the patient back to the ED if:        A.  The seizure lasts longer than 5 minutes.       B.  The patient doesn't awaken shortly after the seizure  C.  The patient has new problems such as difficulty seeing, speaking or moving  D.  The patient was injured during the seizure  E.  The patient has a temperature over 102 F (39C)  F.  The patient vomited and now is having trouble breathing

## 2024-10-11 NOTE — Progress Notes (Signed)
 NEUROLOGY FOLLOW UP OFFICE NOTE  Peter Shaw [id number] May 05, 1996  HISTORY OF PRESENT ILLNESS: I had the pleasure of seeing Peter Shaw in follow-up in the neurology clinic on 10/11/2024.  The patient was last seen 3 months ago for Primary Generalized Epilepsy. He is alone in the office today. Records and images were personally reviewed where available.  Since his last visit, he reports doing well seizure-free since 01/2024 on Lamotrigine  ER 400mg  BID (200mg  2 tabs BID) and Lacosamide  100mg  BID, no side effects. He denies any staring/unresponsive episodes, gaps in time, olfactory/gustatory hallucinations, focal numbness/tingling/weakness, myoclonic jerks. No headaches, dizziness, vision changes, no falls. Sleep and mood are good.   Lamotrigine  level 01/2024: 5.9; Lacosamide  level 06/2024: 2.5  History on Initial Assessment 01/26/2020: This is a pleasant 28 year old right-handed man presenting for second opinion regarding seizures. Records from [name] were reviewed. The first seizure occurred at age 32 right after graduation. he was preparing for a trip and woke up early at 4:30am, there were no warning symptoms, he woke up in the hospital with no memory of events. His mother had heard him fall and witnessed a GTC lasting 5 minutes. He was restless when EMS arrived, confused and disoriented, and brought to a hospital in [place]. He had an MRI and EEG. Per [name] note, EEG in June 2018 was apparently suggestive of generalized epilepsy with 2.5-3 Hz generalized spikes during hyperventilation, bifrontally predominant spike and wave in sleep, and a single left frontal spike. Abnormal discharges noted to be in runs up to 7-10 seconds. MRI brain reportedly unremarkable. He was given a prescription for Keppra  which he did not take. He traveled to [place] with no issues. The next seizure occurred around 2 years later in December 2020, again around 4am. He recalls falling asleep on the couch, he  woke up at 3am, took a shower, then went back to bed at 4am. Around 10 minutes of falling asleep, his roommate heard a loud groan and found him off the bed. There was no report of shaking, he had rolled over, stiff, and was not responding for 5 minutes. No tongue bite or incontinence, he recalls seeing EMS arrive and felt disoriented. His jaw hurt for a week after, no focal weakness. He reports that both occurred on days of very vigorous activity, the first episode occurred in the setting of sleep deprivation, he reports getting a full 8 hours prior to the second event, but was sleep deprived 2 nights prior. There was no alcohol with the first event, he had 1 beer 2 nights prior to the second one. No illicit substances. He denies any olfactory/gustatory hallucinations, deja vu, rising epigastric sensation, focal numbness/tingling/weakness, myoclonic jerks. He denies any headaches, dizziness, diplopia, dysarthria/dysphagia, neck/back pain, bowel/bladder dysfunction. He is a holiday representative in producer, television/film/video in [employer], memory is good.   Epilepsy Risk Factors:  His brother had a GTCs (febrile) at age 15. Otherwise he had a normal birth and early development.  There is no history of febrile convulsions, CNS infections such as meningitis/encephalitis, significant traumatic brain injury, neurosurgical procedures.  Prior ASMs: Levetiracetam   Diagnostic Data: MRI brain without contrast 2018: no acute changes, small benign pars intermedia cyst of the pituitary gland EEG in June 2018 was apparently suggestive of generalized epilepsy with 2.5-3 Hz generalized spikes during hyperventilation, bifrontally predominant spike and wave in sleep, and a single left frontal spike. Abnormal discharges noted to be in runs up to 7-10 seconds.  EEG in August 2021  abnormal due to occasional generalized high voltage 3-4 Hz spike and polyspike and wave discharges with frontal predominance lasting 1-3 seconds  PAST MEDICAL HISTORY: Past  Medical History:  Diagnosis Date   Epilepsy (HCC) 05/23/2017    MEDICATIONS: Current Outpatient Medications on File Prior to Visit  Medication Sig Dispense Refill   Lacosamide  100 MG TABS Take 1 tablet twice a day 60 tablet 5   LamoTRIgine  200 MG TB24 24 hour tablet TAKE 2 TABLETS BY MOUTH TWICE A DAY 360 tablet 3   No current facility-administered medications on file prior to visit.    ALLERGIES: No Known Allergies  FAMILY HISTORY: Family History  Problem Relation Age of Onset   Coronary artery disease Father        triple bypass   Colon cancer Neg Hx    Esophageal cancer Neg Hx    Stomach cancer Neg Hx     SOCIAL HISTORY: Social History   Socioeconomic History   Marital status: Single    Spouse name: Not on file   Number of children: 0   Years of education: Not on file   Highest education level: Not on file  Occupational History    Comment: in college   Occupation: [employer] employee  Tobacco Use   Smoking status: Never   Smokeless tobacco: Never  Vaping Use   Vaping status: Never Used  Substance and Sexual Activity   Alcohol use: Not Currently    Comment: wine once a week   Drug use: No   Sexual activity: Not on file  Other Topics Concern   Not on file  Social History Narrative   Lives with sister   In college [employer]   No caffeine    No children   Right handed    Lives in a complex with elevators   Social Drivers of Health   Financial Resource Strain: Not on file  Food Insecurity: No Food Insecurity (02/21/2024)   Hunger Vital Sign    Worried About Running Out of Food in the Last Year: Never true    Ran Out of Food in the Last Year: Never true  Transportation Needs: No Transportation Needs (02/21/2024)   PRAPARE - Administrator, Civil Service (Medical): No    Lack of Transportation (Non-Medical): No  Physical Activity: Not on file  Stress: Not on file  Social Connections: Not on file  Intimate Partner Violence: Not At Risk (02/21/2024)    Humiliation, Afraid, Rape, and Kick questionnaire    Fear of Current or Ex-Partner: No    Emotionally Abused: No    Physically Abused: No    Sexually Abused: No     PHYSICAL EXAM: Vitals:   10/11/24 1544  BP: 125/81  Pulse: 77  SpO2: 98%   General: No acute distress Head:  Normocephalic/atraumatic Skin/Extremities: No rash, no edema Neurological Exam: alert and awake. No aphasia or dysarthria. Fund of knowledge is appropriate.  Attention and concentration are normal.   Cranial nerves: Pupils equal, round. Extraocular movements intact with no nystagmus. Visual fields full.  No facial asymmetry.  Motor: Bulk and tone normal, muscle strength 5/5 throughout with no pronator drift.   Finger to nose testing intact.  Gait narrow-based and steady, able to tandem walk adequately.  Romberg negative.   IMPRESSION: This is a pleasant 28 yo RH man with primary generalized epilepsy. He had 4 seizures in one day on 02/20/24 in the setting of GI illness. He has been seizure-free since 02/20/24  on Lacosamide  100mg  BID and Lamotrigine  ER 400mg  BID. He reports driving privileges have been reinstated by the [facility], he is aware of [place] driving laws and seizures. Follow-up in 6 months, call for any changes.   Thank you for allowing me to participate in his care.  Please do not hesitate to call for any questions or concerns.    Darice Shivers, M.D.   CC: Dr. Leonel

## 2024-11-15 ENCOUNTER — Encounter: Payer: Self-pay | Admitting: Neurology

## 2025-05-10 ENCOUNTER — Ambulatory Visit: Admitting: Neurology

## 2025-05-12 ENCOUNTER — Ambulatory Visit: Admitting: Neurology
